# Patient Record
Sex: Female | Born: 1975 | Hispanic: No | Marital: Married | Smoking: Never smoker
Health system: Southern US, Community
[De-identification: ages and names within clinical notes are randomized; demographics above are authoritative.]

## PROBLEM LIST (undated history)

## (undated) DIAGNOSIS — R896 Abnormal cytological findings in specimens from other organs, systems and tissues: Secondary | ICD-10-CM

## (undated) DIAGNOSIS — R42 Dizziness and giddiness: Secondary | ICD-10-CM

## (undated) HISTORY — DX: Abnormal cytological findings in specimens from other organs, systems and tissues: R89.6

---

## 2006-01-27 ENCOUNTER — Other Ambulatory Visit: Admission: RE | Admit: 2006-01-27 | Discharge: 2006-01-27 | Payer: Self-pay | Admitting: Gynecology

## 2007-02-28 ENCOUNTER — Other Ambulatory Visit: Admission: RE | Admit: 2007-02-28 | Discharge: 2007-02-28 | Payer: Self-pay | Admitting: Gynecology

## 2008-10-14 ENCOUNTER — Ambulatory Visit: Payer: Self-pay | Admitting: Gynecology

## 2008-10-14 ENCOUNTER — Other Ambulatory Visit: Admission: RE | Admit: 2008-10-14 | Discharge: 2008-10-14 | Payer: Self-pay | Admitting: Gynecology

## 2008-10-14 ENCOUNTER — Encounter: Payer: Self-pay | Admitting: Gynecology

## 2008-10-25 DIAGNOSIS — IMO0001 Reserved for inherently not codable concepts without codable children: Secondary | ICD-10-CM

## 2008-10-25 HISTORY — DX: Reserved for inherently not codable concepts without codable children: IMO0001

## 2008-10-25 HISTORY — PX: COLPOSCOPY: SHX161

## 2008-10-28 ENCOUNTER — Ambulatory Visit: Payer: Self-pay | Admitting: Gynecology

## 2009-08-06 ENCOUNTER — Other Ambulatory Visit: Admission: RE | Admit: 2009-08-06 | Discharge: 2009-08-06 | Payer: Self-pay | Admitting: Gynecology

## 2009-08-06 ENCOUNTER — Ambulatory Visit: Payer: Self-pay | Admitting: Gynecology

## 2010-06-09 ENCOUNTER — Other Ambulatory Visit
Admission: RE | Admit: 2010-06-09 | Discharge: 2010-06-09 | Payer: Self-pay | Source: Home / Self Care | Admitting: Gynecology

## 2010-06-09 ENCOUNTER — Ambulatory Visit: Payer: Self-pay | Admitting: Gynecology

## 2010-07-01 ENCOUNTER — Ambulatory Visit
Admission: RE | Admit: 2010-07-01 | Discharge: 2010-07-01 | Payer: Self-pay | Source: Home / Self Care | Attending: Gynecology | Admitting: Gynecology

## 2010-07-01 ENCOUNTER — Other Ambulatory Visit
Admission: RE | Admit: 2010-07-01 | Discharge: 2010-07-01 | Payer: Self-pay | Source: Home / Self Care | Admitting: Gynecology

## 2011-07-26 ENCOUNTER — Encounter: Payer: Self-pay | Admitting: Gynecology

## 2011-07-27 ENCOUNTER — Encounter: Payer: Self-pay | Admitting: Gynecology

## 2011-07-27 ENCOUNTER — Other Ambulatory Visit (HOSPITAL_COMMUNITY)
Admission: RE | Admit: 2011-07-27 | Discharge: 2011-07-27 | Disposition: A | Discharge status: Home / Self Care | Payer: Self-pay | Source: Ambulatory Visit | Attending: Gynecology | Admitting: Gynecology

## 2011-07-27 ENCOUNTER — Ambulatory Visit (INDEPENDENT_AMBULATORY_CARE_PROVIDER_SITE_OTHER): Payer: Self-pay | Admitting: Gynecology

## 2011-07-27 VITALS — BP 120/74 | Ht 64.0 in | Wt 139.0 lb

## 2011-07-27 DIAGNOSIS — Z13220 Encounter for screening for lipoid disorders: Secondary | ICD-10-CM

## 2011-07-27 DIAGNOSIS — Z01419 Encounter for gynecological examination (general) (routine) without abnormal findings: Secondary | ICD-10-CM | POA: Insufficient documentation

## 2011-07-27 DIAGNOSIS — Z131 Encounter for screening for diabetes mellitus: Secondary | ICD-10-CM

## 2011-07-27 DIAGNOSIS — R87610 Atypical squamous cells of undetermined significance on cytologic smear of cervix (ASC-US): Secondary | ICD-10-CM

## 2011-07-27 DIAGNOSIS — Z309 Encounter for contraceptive management, unspecified: Secondary | ICD-10-CM

## 2011-07-27 DIAGNOSIS — IMO0001 Reserved for inherently not codable concepts without codable children: Secondary | ICD-10-CM

## 2011-07-27 LAB — URINALYSIS W MICROSCOPIC + REFLEX CULTURE
Ketones, ur: NEGATIVE mg/dL
Nitrite: NEGATIVE
Specific Gravity, Urine: 1.02 (ref 1.005–1.030)
Urobilinogen, UA: 0.2 mg/dL (ref 0.0–1.0)

## 2011-07-27 LAB — GLUCOSE, RANDOM: Glucose, Bld: 89 mg/dL (ref 70–99)

## 2011-07-27 LAB — LIPID PANEL
Cholesterol: 178 mg/dL (ref 0–200)
LDL Cholesterol: 98 mg/dL (ref 0–99)
VLDL: 11 mg/dL (ref 0–40)

## 2011-07-27 LAB — CBC WITH DIFFERENTIAL/PLATELET
Hemoglobin: 13.3 g/dL (ref 12.0–15.0)
Lymphs Abs: 3.5 10*3/uL (ref 0.7–4.0)
Monocytes Relative: 7 % (ref 3–12)
Neutro Abs: 3.9 10*3/uL (ref 1.7–7.7)
Neutrophils Relative %: 47 % (ref 43–77)
Platelets: 264 10*3/uL (ref 150–400)
RBC: 4.15 MIL/uL (ref 3.87–5.11)
WBC: 8.3 10*3/uL (ref 4.0–10.5)

## 2011-07-27 MED ORDER — NORETHINDRONE ACET-ETHINYL EST 1-20 MG-MCG PO TABS: 1.0000 | ORAL_TABLET | Freq: Every day | ORAL | Status: DC

## 2011-07-27 NOTE — Patient Instructions (Signed)
Follow up for routine annual exam in one year.

## 2011-07-27 NOTE — Progress Notes (Signed)
Megan Wise Feb 05, 1976 [id number]        36 y.o.  for annual exam.  Several issues noted below  Past medical history,surgical history, medications, allergies, family history and social history were all reviewed and documented in the EPIC chart. ROS:  Was performed and pertinent positives and negatives are included in the history.  Exam: Kim chaperone present Filed Vitals:   07/27/11 1451  BP: 120/74   General appearance  Normal Skin grossly normal Head/Neck normal with no cervical or supraclavicular adenopathy thyroid normal Lungs  clear Cardiac RR, without RMG Abdominal  soft, nontender, without masses, organomegaly or hernia Breasts  examined lying and sitting without masses, retractions, discharge or axillary adenopathy. Pelvic  Ext/BUS/vagina  normal   Cervix  normal high in vaginal vault and anterior Pap done  Uterus  retroverted, normal size, shape and contour, midline and mobile nontender   Adnexa  Without masses or tenderness    Anus and perineum  normal   Rectovaginal  normal sphincter tone without palpated masses or tenderness.    Assessment/Plan:  36 y.o. female for annual exam.   She brought an interpreter who discussed all of the following with her. 1. 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 Colposcopy inadequate ECC negative. Follow up Pap smears were negative. Pap done today. If negative then plan annual follow up. 2. Birth control. Patient stopped her birth control pills saying she did not like the way it made her feel. I reviewed options for contraception and she wants to go ahead and restart low-dose oral contraceptives. I reviewed the risks to include stroke heart attack DVT all of which she understands and accepts and I refilled her with Loestrin 120 equivalent x1 year Sunday start after next menses backup contraception with condoms. 3. Breast health. SBE monthly reviewed. Screening mammograms between 35 and 40 were discussed. She has no strong family  history and will probably wait till 61 for her baseline at her choice. 4. Right hand numbness and swelling. Patient notes intermittently several times during the month her right hand seem to swell with some numbness and tingling when she wakes up in the morning. Her exam today is normal with good capillary refill good pulses no evidence of swelling good muscle strength and sensation. She has no neck pain or limitations in movement. I reviewed with her as it is occasional was waking I suspect that she is "sleeping wrong" and I would watch for now. Symptoms more consistent I will refer or further evaluation. 5. Health maintenance. Will check baseline CBC glucose lipid profile urinalysis. Assuming she starts the pills does well she will see me in a year, sooner as needed    Dara Lords MD, 3:37 PM 07/27/2011

## 2011-08-17 ENCOUNTER — Ambulatory Visit (INDEPENDENT_AMBULATORY_CARE_PROVIDER_SITE_OTHER): Payer: Self-pay | Admitting: Gynecology

## 2011-08-17 ENCOUNTER — Other Ambulatory Visit (HOSPITAL_COMMUNITY)
Admission: RE | Admit: 2011-08-17 | Discharge: 2011-08-17 | Disposition: A | Discharge status: Home / Self Care | Payer: Self-pay | Source: Ambulatory Visit | Attending: Gynecology | Admitting: Gynecology

## 2011-08-17 ENCOUNTER — Encounter: Payer: Self-pay | Admitting: Gynecology

## 2011-08-17 DIAGNOSIS — R87616 Satisfactory cervical smear but lacking transformation zone: Secondary | ICD-10-CM

## 2011-08-17 DIAGNOSIS — Z01419 Encounter for gynecological examination (general) (routine) without abnormal findings: Secondary | ICD-10-CM | POA: Insufficient documentation

## 2011-08-17 NOTE — Progress Notes (Signed)
Patient presents for re\re Pap. She had a recent annual exam her Pap smear was normal but in adequate as no endocervical cells were seen.  She does have a history of ascus with positive high risk HPV in 2010 with a negative colposcopy and a negative ECC. Her intervening Pap smears have been negative.  Exam was Sherrilyn Rist chaperone present. External BUS vagina normal. Cervix normal. Pap done with endocervical mucus noted on the brush.  Assessment and plan: Inadequate Pap smear. Pap redone today. Assuming negative then annual follow up.

## 2011-08-17 NOTE — Patient Instructions (Signed)
Follow up for Pap smear results. Assuming normal then follow up in one year.

## 2012-01-29 ENCOUNTER — Ambulatory Visit (INDEPENDENT_AMBULATORY_CARE_PROVIDER_SITE_OTHER): Payer: BC Managed Care – PPO | Admitting: Family Medicine

## 2012-01-29 VITALS — BP 106/67 | HR 67 | Temp 98.1°F | Resp 16 | Ht 63.5 in | Wt 136.0 lb

## 2012-01-29 DIAGNOSIS — T63481A Toxic effect of venom of other arthropod, accidental (unintentional), initial encounter: Secondary | ICD-10-CM

## 2012-01-29 DIAGNOSIS — T6391XA Toxic effect of contact with unspecified venomous animal, accidental (unintentional), initial encounter: Secondary | ICD-10-CM

## 2012-01-29 MED ORDER — PREDNISONE 20 MG PO TABS: ORAL_TABLET | ORAL | Status: AC

## 2012-01-29 NOTE — Patient Instructions (Addendum)
Apply ice and elevate your hand. You can apply ice for about 15 minutes at a time- use a towel to protect your hand from the ice You can take ibuprofen (advil) as needed for swelling and pain Benadryl can help with itching  As long as you are getting better in the next day or so, nothing further is needed. If your swelling and itching do not go away, you may use your prescription for prednisone.

## 2012-01-29 NOTE — Progress Notes (Signed)
[facility] [address], [place] [address] [phone]  Date:  01/29/2012   Name:  Megan Wise   DOB:  [birth year]   MRN:  [id number]  PCP:  Dara Lords, MD    Chief Complaint: Insect Bite   History of Present Illness:  Megan Wise is a 36 y.o. very pleasant female patient who presents with the following:  She was stung on he left hand by a ?hornet yesterday afternoon.  The bee was in her workplace and stung her.  The area itches and hurts.  She has no history of severe allergic reactions to insect stings. She has not noted any SOB, lip/ tongue/ face swelling, or other worrisome symptoms. Mceachron is generally healthy and has no other health problems that she knows of (excpet as noted below). It seems that some friends told them that Trenyce needed to have a shot of some type for her sting, so they came to clinic today for evaluation.  They have not yet tried any medications or ice for her symptoms.    LMP 01/22/12  Patient Active Problem List  Diagnosis  . ASCUS (atypical squamous cells of undetermined significance) on Pap smear    Past Medical History  Diagnosis Date  . ASCUS (atypical squamous cells of undetermined significance) on Pap smear 10/2008    High risk HPV    Past Surgical History  Procedure Date  . Cesarean section   . Colposcopy 10/2008    ECC neg    History  Substance Use Topics  . Smoking status: Never Smoker   . Smokeless tobacco: Not on file  . Alcohol Use: 1.0 oz/week    2 drink(s) per week    No family history on file.  No Known Allergies  Medication list has been reviewed and updated.  Current Outpatient Prescriptions on File Prior to Visit  Medication Sig Dispense Refill  . Multiple Vitamin (MULTIVITAMIN) tablet Take 1 tablet by mouth daily.      . norethindrone-ethinyl estradiol (MICROGESTIN,JUNEL,LOESTRIN) 1-20 MG-MCG tablet Take 1 tablet by mouth daily.  1 Package  11    Review of Systems:  As per HPI-  otherwise negative.   Physical Examination: Filed Vitals:   01/29/12 0903  BP: 106/67  Pulse: 67  Temp: 98.1 F (36.7 C)  Resp: 16   Filed Vitals:   01/29/12 0903  Height: 5' 3.5" (1.613 m)  Weight: 136 lb (61.689 kg)   Body mass index is 23.71 kg/(m^2). Ideal Body Weight: Weight in (lb) to have BMI = 25: 143.1   GEN: WDWN, NAD, Non-toxic, A & O x 3, here today with her husband. There is some language barrier with [place] but her husband has adequate English and helps interpret HEENT: Atraumatic, Normocephalic. Neck supple. No masses, No LAD.  PEERL.  Oropharynx wnl- there is no angioedema, no lip or tongue swelling.   Ears and Nose: No external deformity. CV: RRR, No M/G/R. No JVD. No thrill. No extra heart sounds. PULM: CTA B, no wheezes, crackles, rhonchi. No retractions. No resp. distress. No accessory muscle use. ABD: S, NT, ND.   EXTR: No c/c NEURO Normal gait.  PSYCH: Normally interactive. Conversant. Not depressed or anxious appearing.  Calm demeanor.  Left hand: there is evidence of an insect sting on the dorsum of her hand.  There is mild localized swelling on the ulnar side of the hand, and some mild swelling of the long, ring and pinky fingers.  Normal cap refill,  grip strength is impeded due to swelling.    Assessment and Plan: 1. Insect sting  predniSONE (DELTASONE) 20 MG tablet   Went over care of insect sting- see patient instructions.  Suspect that Mariabella will be better in the next day or so.  If she is not, may fill prednisone to help resolve symptoms.  However, at this point explained that I think potential harms of steroid injection outweigh benefits.   Abbe Amsterdam, MD

## 2012-03-07 ENCOUNTER — Ambulatory Visit (INDEPENDENT_AMBULATORY_CARE_PROVIDER_SITE_OTHER): Payer: BC Managed Care – PPO | Admitting: Family Medicine

## 2012-03-07 VITALS — BP 97/60 | HR 91 | Temp 98.1°F | Resp 16 | Ht 63.0 in | Wt 141.0 lb

## 2012-03-07 DIAGNOSIS — L509 Urticaria, unspecified: Secondary | ICD-10-CM

## 2012-03-07 MED ORDER — METHYLPREDNISOLONE 4 MG PO KIT: PACK | ORAL | Status: AC

## 2012-03-07 MED ORDER — METHYLPREDNISOLONE 4 MG PO KIT: PACK | ORAL | Status: DC

## 2012-03-07 NOTE — Patient Instructions (Addendum)
Buy Benadryl 25mg  over the counter. Please take Benadryl 50mg  tonight when you get home (2 pills) This will make you drowsy, please be careful. If the rash has not improved by the morning, you may fill the prescription for steroids at the pharmacy and take as directed.   Hives Hives are itchy, red, puffy patches on the skin. Hives may change in size and shape. They may also show up in new places on the body. Hives may be a reaction to something that was eaten, touched, or put on the skin. Hives can also be a reaction to cold, heat, infections, medicine, insect bites, or stress. Hives cannot spread from one person to another. HOME CARE  Stay away from what caused the hives, if this applies.   To relieve itching and rash:   Apply cold packs to the skin. A cool bath may also help. Do not take hot baths or showers. Warm water makes the itching worse.   Take medicine to shrink the hives as told by your doctor. This medicine can make you sleepy. Do not drive if you take this medicine.   Only take medicine as told by your doctor.   Wear loose-fitting clothes and underwear.   Follow up with your doctor.  GET HELP RIGHT AWAY IF:   You have a fever.   Your lips or tongue puff up (swell).   Breathing or swallowing is hard to do.   Throat or chest tightness develops.   Belly (abdominal) pain develops.  These may be the first signs of a life-threatening allergic reaction. This is an emergency. Call your local emergency services (911 in [place] MAKE SURE YOU:   Understand these instructions.   Will watch your condition.   Will get help right away if you are not doing well or get worse.  Document Released: 03/22/2008 Document Revised: 06/02/2011 Document Reviewed: 05/19/2009 [facility] Patient Information 2012 [place], [place].

## 2012-03-07 NOTE — Progress Notes (Signed)
[facility]:  Office Visit  Chief Complaint:  Chief Complaint  Patient presents with  . Urticaria    All over; started last night    HPI: Megan Wise is a 36 y.o. female who complains of  Hives all over on legs and hands. No known triggers except possible tuna. No new meds, detergents/soaps, travels, insect bites. Denies CP/SOB. No problems with face or chest. Has not tried anythign for it. Had chicken and rice for dinenr last night but prior to that had tuna. Has had tuna prior without any problems.   Past Medical History  Diagnosis Date  . ASCUS (atypical squamous cells of undetermined significance) on Pap smear 10/2008    High risk HPV   Past Surgical History  Procedure Date  . Cesarean section   . Colposcopy 10/2008    ECC neg   History   Social History  . Marital Status: Married    Spouse Name: N/A    Number of Children: N/A  . Years of Education: N/A   Social History Main Topics  . Smoking status: Never Smoker   . Smokeless tobacco: None  . Alcohol Use: 1.0 oz/week    2 drink(s) per week  . Drug Use: No  . Sexually Active: Yes    Birth Control/ Protection: Condom   Other Topics Concern  . None   Social History Narrative  . None   No family history on file. No Known Allergies Prior to Admission medications   Not on File     ROS: The patient denies fevers, chills, night sweats, unintentional weight loss, chest pain, palpitations, wheezing, dyspnea on exertion, nausea, vomiting, abdominal pain, dysuria, hematuria, melena, numbness, weakness, or tingling.   All other systems have been reviewed and were otherwise negative with the exception of those mentioned in the HPI and as above.    PHYSICAL EXAM: Filed Vitals:   03/07/12 1629  BP: 97/60  Pulse: 91  Temp: 98.1 F (36.7 C)  Resp: 16   Filed Vitals:   03/07/12 1629  Height: 5\' 3"  (1.6 m)  Weight: 141 lb (63.957 kg)   Body mass index is 24.98 kg/(m^2).  General: Alert, no  acute distress HEENT:  Normocephalic, atraumatic, oropharynx patent. No exudates Cardiovascular:  Regular rate and rhythm, no rubs murmurs or gallops.  No Carotid bruits, radial pulse intact. No pedal edema.  Respiratory: Clear to auscultation bilaterally.  No wheezes, rales, or rhonchi.  No cyanosis, no use of accessory musculature GI: No organomegaly, abdomen is soft and non-tender, positive bowel sounds.  No masses. Skin: Diffues hives UE and Megan Wise bilaterally and on back. None on face or chest.  Neurologic: Facial musculature symmetric. Psychiatric: Patient is appropriate throughout our interaction. Lymphatic: No cervical lymphadenopathy Musculoskeletal: Gait intact.   LABS: Results for orders placed in visit on 07/27/11  CBC WITH DIFFERENTIAL      Component Value Range   WBC 8.3  4.0 - 10.5 K/uL   RBC 4.15  3.87 - 5.11 MIL/uL   Hemoglobin 13.3  12.0 - 15.0 g/dL   HCT 16.1  09.6 - 04.5 %   MCV 93.0  78.0 - 100.0 fL   MCH 32.0  26.0 - 34.0 pg   MCHC 34.5  30.0 - 36.0 g/dL   RDW 40.9  81.1 - 91.4 %   Platelets 264  150 - 400 K/uL   Neutrophils Relative 47  43 - 77 %   Neutro Abs 3.9  1.7 - 7.7  K/uL   Lymphocytes Relative 43  12 - 46 %   Lymphs Abs 3.5  0.7 - 4.0 K/uL   Monocytes Relative 7  3 - 12 %   Monocytes Absolute 0.6  0.1 - 1.0 K/uL   Eosinophils Relative 3  0 - 5 %   Eosinophils Absolute 0.2  0.0 - 0.7 K/uL   Basophils Relative 1  0 - 1 %   Basophils Absolute 0.1  0.0 - 0.1 K/uL   Smear Review Criteria for review not met    GLUCOSE, RANDOM      Component Value Range   Glucose, Bld 89  70 - 99 mg/dL  LIPID PANEL      Component Value Range   Cholesterol 178  0 - 200 mg/dL   Triglycerides 53  <161 mg/dL   HDL 69  >09 mg/dL   Total CHOL/HDL Ratio 2.6     VLDL 11  0 - 40 mg/dL   LDL Cholesterol 98  0 - 99 mg/dL  URINALYSIS WITH CULTURE REFLEX      Component Value Range   Color, Urine YELLOW  YELLOW   APPearance CLEAR  CLEAR   Specific Gravity, Urine 1.020  1.005 -  1.030   pH 8.5 (*) 5.0 - 8.0   Glucose, UA NEG  NEG mg/dL   Bilirubin Urine NEG  NEG   Ketones, ur NEG  NEG mg/dL   Hgb urine dipstick NEG  NEG   Protein, ur NEG  NEG mg/dL   Urobilinogen, UA 0.2  0.0 - 1.0 mg/dL   Nitrite NEG  NEG   Leukocytes, UA NEG  NEG     EKG/XRAY:   Primary read interpreted by Dr. Conley Rolls at [facility].   ASSESSMENT/PLAN: Encounter Diagnosis  Name Primary?  Marland Kitchen Urticaria, unspecified Yes   Take Benadryl 50 mg x 1 tonight, then 25 mg q8h until resolved. IF no improvement after 50 mg then need to take medrol dose pack. Rx given.  Go to ER prn for SOB/CP     Megan Deems PHUONG, DO 03/07/2012 5:23 PM

## 2013-05-10 ENCOUNTER — Encounter: Payer: BC Managed Care – PPO | Admitting: Gynecology

## 2013-05-31 ENCOUNTER — Ambulatory Visit (INDEPENDENT_AMBULATORY_CARE_PROVIDER_SITE_OTHER): Payer: BC Managed Care – PPO | Admitting: Gynecology

## 2013-05-31 ENCOUNTER — Other Ambulatory Visit (HOSPITAL_COMMUNITY)
Admission: RE | Admit: 2013-05-31 | Discharge: 2013-05-31 | Disposition: A | Discharge status: Home / Self Care | Payer: BC Managed Care – PPO | Source: Ambulatory Visit | Attending: Gynecology | Admitting: Gynecology

## 2013-05-31 ENCOUNTER — Encounter: Payer: Self-pay | Admitting: Gynecology

## 2013-05-31 VITALS — BP 120/76 | Ht 64.0 in | Wt 154.0 lb

## 2013-05-31 DIAGNOSIS — Z1151 Encounter for screening for human papillomavirus (HPV): Secondary | ICD-10-CM | POA: Insufficient documentation

## 2013-05-31 DIAGNOSIS — Z01419 Encounter for gynecological examination (general) (routine) without abnormal findings: Secondary | ICD-10-CM

## 2013-05-31 DIAGNOSIS — R42 Dizziness and giddiness: Secondary | ICD-10-CM

## 2013-05-31 LAB — COMPREHENSIVE METABOLIC PANEL
ALT: 23 U/L (ref 0–35)
Albumin: 3.8 g/dL (ref 3.5–5.2)
Alkaline Phosphatase: 75 U/L (ref 39–117)
BUN: 10 mg/dL (ref 6–23)
Calcium: 9.3 mg/dL (ref 8.4–10.5)
Creat: 0.72 mg/dL (ref 0.50–1.10)
Glucose, Bld: 100 mg/dL — ABNORMAL HIGH (ref 70–99)
Potassium: 4 mEq/L (ref 3.5–5.3)
Total Bilirubin: 0.5 mg/dL (ref 0.3–1.2)
Total Protein: 6.9 g/dL (ref 6.0–8.3)

## 2013-05-31 LAB — CBC WITH DIFFERENTIAL/PLATELET
Basophils Absolute: 0.1 10*3/uL (ref 0.0–0.1)
Basophils Relative: 1 % (ref 0–1)
Eosinophils Absolute: 0.2 10*3/uL (ref 0.0–0.7)
Eosinophils Relative: 2 % (ref 0–5)
HCT: 41.2 % (ref 36.0–46.0)
MCH: 31.7 pg (ref 26.0–34.0)
MCHC: 34.5 g/dL (ref 30.0–36.0)
Monocytes Absolute: 0.7 10*3/uL (ref 0.1–1.0)
Monocytes Relative: 7 % (ref 3–12)
Neutro Abs: 5.8 10*3/uL (ref 1.7–7.7)
RDW: 13.3 % (ref 11.5–15.5)

## 2013-05-31 LAB — LIPID PANEL: Cholesterol: 176 mg/dL (ref 0–200)

## 2013-05-31 LAB — TSH: TSH: 1.517 u[IU]/mL (ref 0.350–4.500)

## 2013-05-31 NOTE — Addendum Note (Signed)
Addended by: Dayna Barker on: 05/31/2013 12:50 PM   Modules accepted: Orders

## 2013-05-31 NOTE — Patient Instructions (Signed)
Office will call you to help arrange neurology appointment. If you do not hear from the office in one week call us. Followup in one year for annual exam.

## 2013-05-31 NOTE — Progress Notes (Signed)
Megan Wise Jan 18, 1976 [id number]        37 y.o.  G2P1011 for annual exam.  Several issues noted below.  Past medical history,surgical history, problem list, medications, allergies, family history and social history were all reviewed and documented in the EPIC chart.  ROS:  Performed and pertinent positives and negatives are included in the history, assessment and plan .  Exam: Kim assistant Filed Vitals:   05/31/13 1214  BP: 120/76  Height: 5\' 4"  (1.626 m)  Weight: 154 lb (69.854 kg)   General appearance  Normal Skin grossly normal Head/Neck normal with no cervical or supraclavicular adenopathy thyroid normal Lungs  clear Cardiac RR, without RMG Abdominal  soft, nontender, without masses, organomegaly or hernia Breasts  examined lying and sitting without masses, retractions, discharge or axillary adenopathy. Pelvic  Ext/BUS/vagina  Normal   Cervix  Normal Pap/HPV  Uterus  retroverted, normal size, shape and contour, midline and mobile nontender   Adnexa  Without masses or tenderness    Anus and perineum  Normal   Rectovaginal  Normal sphincter tone without palpated masses or tenderness.    Assessment/Plan:  37 y.o. G30P1011 female for annual exam, regular menses, condom contraception.   1. Contraceptive management. Patient stopped her birth control pills. Is using condoms and happy with this. Discussed increased failure risk and availability of plan B. Will followup if she decides for alternatives. 2. Dizzy spells. Patient having weekly dizzy spells last seconds to a minute. Unrelated to position or positional changes. Occurs throughout the day unrelated to meals. Gen. physical exam is normal without localizing neurologic findings. Check baseline studies to include CBC comp and some metabolic panel thyroid. Recommend neurologist evaluation and we will help her arrange this and she knows to followup for this. 3. Pap smear 2013. Pap/HPV today. History of ASCUS with positive high-risk  HPV 2010. Negative colposcopy with ECC. Assuming this Pap smear normal then we'll plan less frequent screening intervals. 4. Breast health. SBE monthly reviewed. Screening mammographic recommendations between 35 and 40 reviewed. Patient turning 37 this coming year and will schedule mammography. 5. Health maintenance. Baseline CBC comprehensive metabolic panel lipid profile urinalysis TSH ordered. Followup one year, sooner as needed.   Note: This document was prepared with digital dictation and possible smart phrase technology. Any transcriptional errors that result from this process are unintentional.   Dara Lords MD, 12:42 PM 05/31/2013

## 2013-06-01 LAB — URINALYSIS W MICROSCOPIC + REFLEX CULTURE
Bilirubin Urine: NEGATIVE
Crystals: NONE SEEN
Glucose, UA: NEGATIVE mg/dL
Specific Gravity, Urine: 1.024 (ref 1.005–1.030)
Squamous Epithelial / LPF: NONE SEEN
Urobilinogen, UA: 0.2 mg/dL (ref 0.0–1.0)

## 2013-06-03 ENCOUNTER — Telehealth: Payer: Self-pay | Admitting: *Deleted

## 2013-06-03 NOTE — Telephone Encounter (Signed)
Message copied by Aura Camps on Mon Jun 03, 2013  9:00 AM ------      Message from: Dara Lords      Created: Fri May 31, 2013  2:28 PM       Patient needs appointment to see neurologist such as Dr. Amelia Jo reference recurrent dizzy episodes ------

## 2013-06-03 NOTE — Telephone Encounter (Signed)
appt on 06/10/13 @ 10:15 am, time and date informed by pt friend who has DPR access Ratana.

## 2014-04-28 ENCOUNTER — Encounter: Payer: Self-pay | Admitting: Gynecology

## 2014-07-01 ENCOUNTER — Encounter: Payer: Self-pay | Admitting: Gynecology

## 2014-07-01 ENCOUNTER — Ambulatory Visit (INDEPENDENT_AMBULATORY_CARE_PROVIDER_SITE_OTHER): Payer: 59 | Admitting: Gynecology

## 2014-07-01 VITALS — BP 120/70 | Ht 63.0 in | Wt 154.0 lb

## 2014-07-01 DIAGNOSIS — N951 Menopausal and female climacteric states: Secondary | ICD-10-CM

## 2014-07-01 DIAGNOSIS — Z01419 Encounter for gynecological examination (general) (routine) without abnormal findings: Secondary | ICD-10-CM | POA: Diagnosis not present

## 2014-07-01 LAB — LIPID PANEL
CHOL/HDL RATIO: 2.8 ratio
CHOLESTEROL: 186 mg/dL (ref 0–200)
HDL: 67 mg/dL (ref >39)
LDL Cholesterol: 88 mg/dL (ref 0–99)
Triglycerides: 153 mg/dL — ABNORMAL HIGH (ref <150)
VLDL: 31 mg/dL (ref 0–40)

## 2014-07-01 LAB — COMPREHENSIVE METABOLIC PANEL
ALT: 23 U/L (ref 0–35)
AST: 16 U/L (ref 0–37)
Albumin: 4.1 g/dL (ref 3.5–5.2)
Alkaline Phosphatase: 70 U/L (ref 39–117)
BILIRUBIN TOTAL: 0.3 mg/dL (ref 0.2–1.2)
BUN: 11 mg/dL (ref 6–23)
CO2: 30 mEq/L (ref 19–32)
Calcium: 9.2 mg/dL (ref 8.4–10.5)
Chloride: 101 mEq/L (ref 96–112)
Creat: 0.75 mg/dL (ref 0.50–1.10)
Glucose, Bld: 92 mg/dL (ref 70–99)
Potassium: 5 mEq/L (ref 3.5–5.3)
SODIUM: 136 meq/L (ref 135–145)
Total Protein: 7 g/dL (ref 6.0–8.3)

## 2014-07-01 LAB — TSH: TSH: 2.378 u[IU]/mL (ref 0.350–4.500)

## 2014-07-01 NOTE — Progress Notes (Signed)
Megan Wise [birth year] [id number]        39 y.o.  G2P1011 for annual exam.  Several issues noted below.  Past medical history,surgical history, problem list, medications, allergies, family history and social history were all reviewed and documented as reviewed in the EPIC chart.  ROS:  Performed with pertinent positives and negatives included in the history, assessment and plan.   Additional significant findings :  none   Exam: [facility] personassistant Filed Vitals:   07/01/14 1536  BP: 120/70  Height: 5\' 3"  (1.6 m)  Weight: 154 lb (69.854 kg)   General appearance:  Normal affect, orientation and appearance. Skin: Grossly normal HEENT: Without gross lesions.  No cervical or supraclavicular adenopathy. Thyroid normal.  Lungs:  Clear without wheezing, rales or rhonchi Cardiac: RR, without RMG Abdominal:  Soft, nontender, without masses, guarding, rebound, organomegaly or hernia Breasts:  Examined lying and sitting without masses, retractions, discharge or axillary adenopathy. Pelvic:  Ext/BUS/vagina normal  Cervix normal  Uterus retroverted, normal size, shape and contour, midline and mobile nontender   Adnexa  Without masses or tenderness    Anus and perineum  Normal   Rectovaginal  Normal sphincter tone without palpated masses or tenderness.    Assessment/Plan:  39 y.o. 502P1011 female for annual exam with regular menses, condom contraception.   1. Menopausal symptoms. Patient notes some hot flashes coming and going. Also some weight gain and increased hair loss. Exam is normal without evidence of alopecia. Menses are regular. Check baseline labs to include TSH and FSH. Assuming negative then monitor at present. If symptoms continue or certainly worsen will call and possibly refer to internal medicine for further evaluation. 2. Contraception. Patient continues with condoms. We have discussed this multiple times again reviewed the failure risk. Patient understands and accepts does not want  alternatives. 3. Mammogram never. Recommended baseline mammogram now she is turned 38 and she agrees to schedule.  SBE monthly reviewed. 4. Pap smear/HPV negative 2014. No Pap smear done today. History of ASCUS positive high-risk HPV 2010. Colposcopy/ECC negative. Follow up Pap smears normal. Plan Pap smear at 3-5 year interval per current screening guidelines. 5. Health maintenance. Baseline CBC comprehensive metabolic panel lipid profile urinalysis ordered with both TSH and FSH. Follow up for results otherwise 1 year, sooner as needed.     [name],[name] MD, 4:02 PM 07/01/2014

## 2014-07-01 NOTE — Patient Instructions (Addendum)
Call to Schedule your mammogram  Facilities in [place]: 1)  [facility], [address] Phone: [phone] 2)  [facility]. [address] [place] Phone: [phone] 3)  Dr. Isaiah Blakes at [facility] [address] Phone: [phone]     Mammogram A mammogram is an X-ray test to find changes in a woman's breast. You should get a mammogram if:  You are 41 years of age or older  You have risk factors.   Your doctor recommends that you have one.  BEFORE THE TEST  Do not schedule the test the week before your period, especially if your breasts are sore during this time.  On the day of your mammogram:  Wash your breasts and armpits well. After washing, do not put on any deodorant or talcum powder on until after your test.   Eat and drink as you usually do.   Take your medicines as usual.   If you are diabetic and take insulin, make sure you:   Eat before coming for your test.   Take your insulin as usual.   If you cannot keep your appointment, call before the appointment to cancel. Schedule another appointment.  TEST  You will need to undress from the waist up. You will put on a hospital gown.   Your breast will be put on the mammogram machine, and it will press firmly on your breast with a piece of plastic called a compression paddle. This will make your breast flatter so that the machine can X-ray all parts of your breast.   Both breasts will be X-rayed. Each breast will be X-rayed from above and from the side. An X-ray might need to be taken again if the picture is not good enough.   The mammogram will last about 15 to 30 minutes.  AFTER THE TEST Finding out the results of your test Ask when your test results will be ready. Make sure you get your test results.  Document Released: 09/09/2008 Document Revised: 06/02/2011 Document Reviewed: 09/09/2008 [facility] Patient  Information 2012 [place].  You may obtain a copy of any labs that were done today by logging onto MyChart as outlined in the instructions provided with your AVS (after visit summary). The office will not call with normal lab results but certainly if there are any significant abnormalities then we will contact you.   Health Maintenance, Female A healthy lifestyle and preventative care can promote health and wellness.  Maintain regular health, dental, and eye exams.  Eat a healthy diet. Foods like vegetables, fruits, whole grains, low-fat dairy products, and lean protein foods contain the nutrients you need without too many calories. Decrease your intake of foods high in solid fats, added sugars, and salt. Get information about a proper diet from your caregiver, if necessary.  Regular physical exercise is one of the most important things you can do for your health. Most adults should get at least 150 minutes of moderate-intensity exercise (any activity that increases your heart rate and causes you to sweat) each week. In addition, most adults need muscle-strengthening exercises on 2 or more days a week.   Maintain a healthy weight. The body mass index (BMI) is a screening tool to identify possible weight problems. It provides an estimate of body fat based on height and weight. Your caregiver can help determine your BMI, and can help you achieve or maintain a healthy weight. For adults  20 years and older:  A BMI below 18.5 is considered underweight.  A BMI of 18.5 to 24.9 is normal.  A BMI of 25 to 29.9 is considered overweight.  A BMI of 30 and above is considered obese.  Maintain normal blood lipids and cholesterol by exercising and minimizing your intake of saturated fat. Eat a balanced diet with plenty of fruits and vegetables. Blood tests for lipids and cholesterol should begin at age 20 and be repeated every 5 years. If your lipid or cholesterol levels are high, you are over 50, or  you are a high risk for heart disease, you may need your cholesterol levels checked more frequently.Ongoing high lipid and cholesterol levels should be treated with medicines if diet and exercise are not effective.  If you smoke, find out from your caregiver how to quit. If you do not use tobacco, do not start.  Lung cancer screening is recommended for adults aged 55 80 years who are at high risk for developing lung cancer because of a history of smoking. Yearly low-dose computed tomography (CT) is recommended for people who have at least a 30-pack-year history of smoking and are a current smoker or have quit within the past 15 years. A pack year of smoking is smoking an average of 1 pack of cigarettes a day for 1 year (for example: 1 pack a day for 30 years or 2 packs a day for 15 years). Yearly screening should continue until the smoker has stopped smoking for at least 15 years. Yearly screening should also be stopped for people who develop a health problem that would prevent them from having lung cancer treatment.  If you are pregnant, do not drink alcohol. If you are breastfeeding, be very cautious about drinking alcohol. If you are not pregnant and choose to drink alcohol, do not exceed 1 drink per day. One drink is considered to be 12 ounces (355 mL) of beer, 5 ounces (148 mL) of wine, or 1.5 ounces (44 mL) of liquor.  Avoid use of street drugs. Do not share needles with anyone. Ask for help if you need support or instructions about stopping the use of drugs.  High blood pressure causes heart disease and increases the risk of stroke. Blood pressure should be checked at least every 1 to 2 years. Ongoing high blood pressure should be treated with medicines, if weight loss and exercise are not effective.  If you are 55 to 41 years old, ask your caregiver if you should take aspirin to prevent strokes.  Diabetes screening involves taking a blood sample to check your fasting blood sugar level. This  should be done once every 3 years, after age 45, if you are within normal weight and without risk factors for diabetes. Testing should be considered at a younger age or be carried out more frequently if you are overweight and have at least 1 risk factor for diabetes.  Breast cancer screening is essential preventative care for women. You should practice "breast self-awareness." This means understanding the normal appearance and feel of your breasts and may include breast self-examination. Any changes detected, no matter how small, should be reported to a caregiver. Women in their 20s and 30s should have a clinical breast exam (CBE) by a caregiver as part of a regular health exam every 1 to 3 years. After age 40, women should have a CBE every year. Starting at age 40, women should consider having a mammogram (breast X-ray) every year. Women who have a   family history of breast cancer should talk to their caregiver about genetic screening. Women at a high risk of breast cancer should talk to their caregiver about having an MRI and a mammogram every year.  Breast cancer gene (BRCA)-related cancer risk assessment is recommended for women who have family members with BRCA-related cancers. BRCA-related cancers include breast, ovarian, tubal, and peritoneal cancers. Having family members with these cancers may be associated with an increased risk for harmful changes (mutations) in the breast cancer genes BRCA1 and BRCA2. Results of the assessment will determine the need for genetic counseling and BRCA1 and BRCA2 testing.  The Pap test is a screening test for cervical cancer. Women should have a Pap test starting at age 33. Between ages 75 and 14, Pap tests should be repeated every 2 years. Beginning at age 4, you should have a Pap test every 3 years as long as the past 3 Pap tests have been normal. If you had a hysterectomy for a problem that was not cancer or a condition that could lead to cancer, then you no longer  need Pap tests. If you are between ages 72 and 12, and you have had normal Pap tests going back 10 years, you no longer need Pap tests. If you have had past treatment for cervical cancer or a condition that could lead to cancer, you need Pap tests and screening for cancer for at least 20 years after your treatment. If Pap tests have been discontinued, risk factors (such as a new sexual partner) need to be reassessed to determine if screening should be resumed. Some women have medical problems that increase the chance of getting cervical cancer. In these cases, your caregiver may recommend more frequent screening and Pap tests.  The human papillomavirus (HPV) test is an additional test that may be used for cervical cancer screening. The HPV test looks for the virus that can cause the cell changes on the cervix. The cells collected during the Pap test can be tested for HPV. The HPV test could be used to screen women aged 84 years and older, and should be used in women of any age who have unclear Pap test results. After the age of 73, women should have HPV testing at the same frequency as a Pap test.  Colorectal cancer can be detected and often prevented. Most routine colorectal cancer screening begins at the age of 56 and continues through age 76. However, your caregiver may recommend screening at an earlier age if you have risk factors for colon cancer. On a yearly basis, your caregiver may provide home test kits to check for hidden blood in the stool. Use of a small camera at the end of a tube, to directly examine the colon (sigmoidoscopy or colonoscopy), can detect the earliest forms of colorectal cancer. Talk to your caregiver about this at age 20, when routine screening begins. Direct examination of the colon should be repeated every 5 to 10 years through age 83, unless early forms of pre-cancerous polyps or small growths are found.  Hepatitis C blood testing is recommended for all people born from [birth year]  through 1965 and any individual with known risks for hepatitis C.  Practice safe sex. Use condoms and avoid high-risk sexual practices to reduce the spread of sexually transmitted infections (STIs). Sexually active women aged 28 and younger should be checked for Chlamydia, which is a common sexually transmitted infection. Older women with new or multiple partners should also be tested for Chlamydia. Testing for  other STIs is recommended if you are sexually active and at increased risk.  Osteoporosis is a disease in which the bones lose minerals and strength with aging. This can result in serious bone fractures. The risk of osteoporosis can be identified using a bone density scan. Women ages 35 and over and women at risk for fractures or osteoporosis should discuss screening with their caregivers. Ask your caregiver whether you should be taking a calcium supplement or vitamin D to reduce the rate of osteoporosis.  Menopause can be associated with physical symptoms and risks. Hormone replacement therapy is available to decrease symptoms and risks. You should talk to your caregiver about whether hormone replacement therapy is right for you.  Use sunscreen. Apply sunscreen liberally and repeatedly throughout the day. You should seek shade when your shadow is shorter than you. Protect yourself by wearing long sleeves, pants, a wide-brimmed hat, and sunglasses year round, whenever you are outdoors.  Notify your caregiver of new moles or changes in moles, especially if there is a change in shape or color. Also notify your caregiver if a mole is larger than the size of a pencil eraser.  Stay current with your immunizations. Document Released: 12/27/2010 Document Revised: 10/08/2012 Document Reviewed: 12/27/2010 [facility] Patient Information 2014 [place].

## 2014-07-02 LAB — CBC WITH DIFFERENTIAL/PLATELET
BASOS ABS: 0 10*3/uL (ref 0.0–0.1)
Basophils Relative: 0 % (ref 0–1)
Eosinophils Absolute: 0.2 10*3/uL (ref 0.0–0.7)
Eosinophils Relative: 2 % (ref 0–5)
HCT: 42.5 % (ref 36.0–46.0)
Hemoglobin: 14.1 g/dL (ref 12.0–15.0)
Lymphocytes Relative: 40 % (ref 12–46)
Lymphs Abs: 4 10*3/uL (ref 0.7–4.0)
MCH: 31.9 pg (ref 26.0–34.0)
MCHC: 33.2 g/dL (ref 30.0–36.0)
MCV: 96.2 fL (ref 78.0–100.0)
MONO ABS: 0.9 10*3/uL (ref 0.1–1.0)
MPV: 9.2 fL (ref 8.6–12.4)
Monocytes Relative: 9 % (ref 3–12)
NEUTROS ABS: 4.9 10*3/uL (ref 1.7–7.7)
Neutrophils Relative %: 49 % (ref 43–77)
PLATELETS: 281 10*3/uL (ref 150–400)
RBC: 4.42 MIL/uL (ref 3.87–5.11)
RDW: 13.2 % (ref 11.5–15.5)
WBC: 10.1 10*3/uL (ref 4.0–10.5)

## 2014-07-02 LAB — URINALYSIS W MICROSCOPIC + REFLEX CULTURE
BACTERIA UA: NONE SEEN
Bilirubin Urine: NEGATIVE
CASTS: NONE SEEN
CRYSTALS: NONE SEEN
GLUCOSE, UA: NEGATIVE mg/dL
HGB URINE DIPSTICK: NEGATIVE
Ketones, ur: NEGATIVE mg/dL
Leukocytes, UA: NEGATIVE
NITRITE: NEGATIVE
Protein, ur: NEGATIVE mg/dL
Specific Gravity, Urine: 1.02 (ref 1.005–1.030)
Squamous Epithelial / LPF: NONE SEEN
Urobilinogen, UA: 0.2 mg/dL (ref 0.0–1.0)
pH: 7.5 (ref 5.0–8.0)

## 2014-07-02 LAB — FOLLICLE STIMULATING HORMONE: FSH: 3.6 m[IU]/mL

## 2015-09-30 ENCOUNTER — Ambulatory Visit (INDEPENDENT_AMBULATORY_CARE_PROVIDER_SITE_OTHER): Payer: BLUE CROSS/BLUE SHIELD | Admitting: Gynecology

## 2015-09-30 ENCOUNTER — Encounter: Payer: Self-pay | Admitting: Gynecology

## 2015-09-30 VITALS — BP 120/76 | Ht 64.0 in | Wt 155.0 lb

## 2015-09-30 DIAGNOSIS — Z01419 Encounter for gynecological examination (general) (routine) without abnormal findings: Secondary | ICD-10-CM

## 2015-09-30 DIAGNOSIS — Z13220 Encounter for screening for lipoid disorders: Secondary | ICD-10-CM

## 2015-09-30 LAB — COMPREHENSIVE METABOLIC PANEL
ALT: 18 U/L (ref 6–29)
AST: 14 U/L (ref 10–30)
Albumin: 4.1 g/dL (ref 3.6–5.1)
Alkaline Phosphatase: 72 U/L (ref 33–115)
BUN: 12 mg/dL (ref 7–25)
CALCIUM: 9.2 mg/dL (ref 8.6–10.2)
CO2: 28 mmol/L (ref 20–31)
Chloride: 103 mmol/L (ref 98–110)
Creat: 0.95 mg/dL (ref 0.50–1.10)
GLUCOSE: 81 mg/dL (ref 65–99)
POTASSIUM: 4.1 mmol/L (ref 3.5–5.3)
Sodium: 140 mmol/L (ref 135–146)
Total Bilirubin: 0.4 mg/dL (ref 0.2–1.2)
Total Protein: 7.2 g/dL (ref 6.1–8.1)

## 2015-09-30 LAB — LIPID PANEL
CHOL/HDL RATIO: 3.1 ratio (ref <=5.0)
CHOLESTEROL: 204 mg/dL — AB (ref 125–200)
HDL: 66 mg/dL (ref >=46)
LDL Cholesterol: 84 mg/dL (ref <130)
TRIGLYCERIDES: 270 mg/dL — AB (ref <150)
VLDL: 54 mg/dL — AB (ref <30)

## 2015-09-30 LAB — CBC WITH DIFFERENTIAL/PLATELET
BASOS PCT: 0 %
Basophils Absolute: 0 cells/uL (ref 0–200)
EOS ABS: 186 {cells}/uL (ref 15–500)
Eosinophils Relative: 2 %
HCT: 43.4 % (ref 35.0–45.0)
Hemoglobin: 14.3 g/dL (ref 11.7–15.5)
LYMPHS PCT: 45 %
Lymphs Abs: 4185 cells/uL — ABNORMAL HIGH (ref 850–3900)
MCH: 31.1 pg (ref 27.0–33.0)
MCHC: 32.9 g/dL (ref 32.0–36.0)
MCV: 94.3 fL (ref 80.0–100.0)
MPV: 9.5 fL (ref 7.5–12.5)
Monocytes Absolute: 465 cells/uL (ref 200–950)
Monocytes Relative: 5 %
NEUTROS PCT: 48 %
Neutro Abs: 4464 cells/uL (ref 1500–7800)
Platelets: 296 10*3/uL (ref 140–400)
RBC: 4.6 MIL/uL (ref 3.80–5.10)
RDW: 13.4 % (ref 11.0–15.0)
WBC: 9.3 10*3/uL (ref 3.8–10.8)

## 2015-09-30 MED ORDER — MEGESTROL ACETATE 20 MG PO TABS: 20.0000 mg | ORAL_TABLET | Freq: Every day | ORAL | Status: DC

## 2015-09-30 NOTE — Patient Instructions (Signed)

## 2015-09-30 NOTE — Progress Notes (Signed)
    Megan Wise 1975-09-02 [id number]        40 y.o.  G2P1011  for annual exam.  Doing well. Accompanied by friend and daughter who helped interpret.  Past medical history,surgical history, problem list, medications, allergies, family history and social history were all reviewed and documented as reviewed in the EPIC chart.  ROS:  Performed with pertinent positives and negatives included in the history, assessment and plan.   Additional significant findings :  none   Exam: Megan Wise Filed Vitals:   09/30/15 1459  BP: 120/76  Height: 5\' 4"  (1.626 m)  Weight: 155 lb (70.308 kg)   General appearance:  Normal affect, orientation and appearance. Skin: Grossly normal HEENT: Without gross lesions.  No cervical or supraclavicular adenopathy. Thyroid normal.  Lungs:  Clear without wheezing, rales or rhonchi Cardiac: RR, without RMG Abdominal:  Soft, nontender, without masses, guarding, rebound, organomegaly or hernia Breasts:  Examined lying and sitting without masses, retractions, discharge or axillary adenopathy. Pelvic:  Ext/BUS/vagina normal  Cervix normal  Uterus retroverted, normal size, shape and contour, midline and mobile nontender   Adnexa without masses or tenderness    Anus and perineum normal   Rectovaginal normal sphincter tone without palpated masses or tenderness.    Assessment/Plan:  40 y.o. 372P1011 female for annual exam with light regular menses, condom contraception.   1. Mammography 01/2015. Patient resented complaining of a left axillary swelling and prominence of tissue below her left breast. Mammography and ultrasound over both these areas were normal. Examining physician at that time felt no abnormalities. They felt that she was feeling her costal margin below her breast. Her exam today is totally normal with no palpable breast/axillary or rib margin abnormalities. Reassured patient that everything felt normal and for her to continue with self exams and  report any changes in what she is feeling. Recommended follow up mammogram in August when she is due.  2. Pap smear/HPV 05/2013 negative.  No Pap smear done today. 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 Colposcopy/ECC negative at that time.  Follow up Pap smear's normal.   3. Contraception. Patient continues using condoms. We have discussed this multiple times and she understands the failure risks and declines any alternatives. 4. Health maintenance. Baseline CBC, CMP, lipid profile, urinalysis ordered. Follow up in one year, sooner as needed.   [name],[name] P MD, 3:21 PM 09/30/2015

## 2015-10-01 ENCOUNTER — Other Ambulatory Visit: Payer: Self-pay | Admitting: Gynecology

## 2015-10-01 ENCOUNTER — Telehealth: Payer: Self-pay | Admitting: *Deleted

## 2015-10-01 DIAGNOSIS — E781 Pure hyperglyceridemia: Secondary | ICD-10-CM

## 2015-10-01 LAB — URINALYSIS W MICROSCOPIC + REFLEX CULTURE
BILIRUBIN URINE: NEGATIVE
Bacteria, UA: NONE SEEN [HPF]
Casts: NONE SEEN [LPF]
Glucose, UA: NEGATIVE
Hgb urine dipstick: NEGATIVE
Leukocytes, UA: NEGATIVE
NITRITE: NEGATIVE
PH: 7 (ref 5.0–8.0)
Protein, ur: NEGATIVE
RBC / HPF: NONE SEEN RBC/HPF (ref <=2)
SPECIFIC GRAVITY, URINE: 1.028 (ref 1.001–1.035)
WBC UA: NONE SEEN WBC/HPF (ref <=5)
Yeast: NONE SEEN [HPF]

## 2015-10-01 NOTE — Telephone Encounter (Signed)
-----   Message from Dara Lordsimothy P Fontaine, MD sent at 09/30/2015  2:56 PM EDT ----- Call her pharmacy and tell them I made a mistake putting a prescription for Megace under her name. I canceled it on my computer but this is the wrong patient and she is not to receive that medication.

## 2015-10-01 NOTE — Telephone Encounter (Signed)
I had to leave the below on pharmacy voicemail the line would not connect to a live person. I left my direct # to call me if questions.

## 2015-10-02 ENCOUNTER — Other Ambulatory Visit: Payer: BLUE CROSS/BLUE SHIELD

## 2015-10-02 DIAGNOSIS — E781 Pure hyperglyceridemia: Secondary | ICD-10-CM

## 2015-10-02 LAB — LIPID PANEL
Cholesterol: 209 mg/dL — ABNORMAL HIGH (ref 125–200)
HDL: 73 mg/dL (ref >=46)
LDL CALC: 95 mg/dL (ref <130)
TRIGLYCERIDES: 207 mg/dL — AB (ref <150)
Total CHOL/HDL Ratio: 2.9 Ratio (ref <=5.0)
VLDL: 41 mg/dL — ABNORMAL HIGH (ref <30)

## 2016-12-06 ENCOUNTER — Ambulatory Visit (INDEPENDENT_AMBULATORY_CARE_PROVIDER_SITE_OTHER): Payer: BLUE CROSS/BLUE SHIELD | Admitting: Gynecology

## 2016-12-06 ENCOUNTER — Other Ambulatory Visit: Payer: Self-pay | Admitting: Gynecology

## 2016-12-06 ENCOUNTER — Encounter: Payer: Self-pay | Admitting: Gynecology

## 2016-12-06 VITALS — BP 140/86 | Ht 64.0 in | Wt 158.0 lb

## 2016-12-06 DIAGNOSIS — Z13220 Encounter for screening for lipoid disorders: Secondary | ICD-10-CM | POA: Diagnosis not present

## 2016-12-06 DIAGNOSIS — Z01419 Encounter for gynecological examination (general) (routine) without abnormal findings: Secondary | ICD-10-CM

## 2016-12-06 DIAGNOSIS — R102 Pelvic and perineal pain: Secondary | ICD-10-CM

## 2016-12-06 DIAGNOSIS — Z1231 Encounter for screening mammogram for malignant neoplasm of breast: Secondary | ICD-10-CM

## 2016-12-06 LAB — CBC WITH DIFFERENTIAL/PLATELET
BASOS ABS: 0 {cells}/uL (ref 0–200)
BASOS PCT: 0 %
EOS PCT: 2 %
Eosinophils Absolute: 178 cells/uL (ref 15–500)
HCT: 45.6 % — ABNORMAL HIGH (ref 35.0–45.0)
Hemoglobin: 14.8 g/dL (ref 11.7–15.5)
LYMPHS PCT: 45 %
Lymphs Abs: 4005 cells/uL — ABNORMAL HIGH (ref 850–3900)
MCH: 31.6 pg (ref 27.0–33.0)
MCHC: 32.5 g/dL (ref 32.0–36.0)
MCV: 97.2 fL (ref 80.0–100.0)
MONOS PCT: 5 %
MPV: 9.3 fL (ref 7.5–12.5)
Monocytes Absolute: 445 cells/uL (ref 200–950)
NEUTROS ABS: 4272 {cells}/uL (ref 1500–7800)
Neutrophils Relative %: 48 %
Platelets: 296 10*3/uL (ref 140–400)
RBC: 4.69 MIL/uL (ref 3.80–5.10)
RDW: 13.4 % (ref 11.0–15.0)
WBC: 8.9 10*3/uL (ref 3.8–10.8)

## 2016-12-06 NOTE — Addendum Note (Signed)
Addended by: Dayna BarkerGARDNER, KIMBERLY K on: 12/06/2016 12:42 PM   Modules accepted: Orders

## 2016-12-06 NOTE — Progress Notes (Signed)
    Megan Wise 1975-10-28 [id number]        41 y.o.  G2P1011 for annual exam.  Also complaining of pelvic pressure in the vaginal area when she is squatting. No real pain but more pushing down her bearing-down symptoms. No urinary symptoms such as frequency, dysuria, urgency, low back pain, fever or chills. No GI symptoms such as nausea, no vomiting, diarrhea, constipation. Currently not sexually active.  Past medical history,surgical history, problem list, medications, allergies, family history and social history were all reviewed and documented as reviewed in the EPIC chart.  ROS:  Performed with pertinent positives and negatives included in the history, assessment and plan.   Additional significant findings :  None   Exam: Kennon PortelaKim Gardner assistant Vitals:   12/06/16 1123  BP: 140/86  Weight: 158 lb (71.7 kg)  Height: 5\' 4"  (1.626 m)   Body mass index is 27.12 kg/m.  General appearance:  Normal affect, orientation and appearance. Skin: Grossly normal HEENT: Without gross lesions.  No cervical or supraclavicular adenopathy. Thyroid normal.  Lungs:  Clear without wheezing, rales or rhonchi Cardiac: RR, without RMG Abdominal:  Soft, nontender, without masses, guarding, rebound, organomegaly or hernia Breasts:  Examined lying and sitting without masses, retractions, discharge or axillary adenopathy. Pelvic:  Ext, BUS, Vagina: Normal  Cervix: Normal. Pap smear done  Uterus: Retroverted, normal size, shape and contour, midline and mobile nontender   Adnexa: Without masses or tenderness    Anus and perineum: Normal   Rectovaginal: Normal sphincter tone without palpated masses or tenderness.    Assessment/Plan:  41 y.o. 912P1011 female for annual exam with regular menses, abstinent contraception.   1. Pelvic/vaginal pressure with squatting. Exam is normal other than retroverted uterus. No localizing symptoms to suggest GU or GI. Regular monthly menses. Recommend baseline ultrasound to  rule out nonpalpable abnormality such as ovarian and patient will schedule follow up for this. 2. Pap smear/HPV 2014. Pap smear done today. 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 Colposcopy/ECC negative at that time. 3. Mammography 2016. Recommended patient call and schedule mammogram now with names and numbers provided and patient agrees to do so. SBE monthly reviewed. Breast exam normal today. 4. Contraception. Patient not currently sexually active and does not plan to be. Using barrier contraception when she was which is acceptable to her. 5. Health maintenance. Baseline CBC, CMP, lipid profile, urinalysis ordered. Blood pressure 140/86 discussed with her. She does have a blood pressure cuff at home and I reviewed normal readings with her. She'll check this in a non-exam situation and follow up if remains elevated. Follow up for ultrasound. Follow up for mammogram. Follow up 1 year for annual exam.  Additional time in excess of her routine gynecologic exam was spent in direct face to face counseling and coordination of care in regards to her pelvic pressure complaints.   [name],Candance Bohlman P MD, 11:44 AM 12/06/2016

## 2016-12-06 NOTE — Patient Instructions (Signed)
Follow up for ultrasound as scheduled  Recheck your blood pressure at home.  It should be 120/70 range   Call to Schedule your mammogram  Facilities in [place]: 1)  [facility]. [facility], [phone] Phone: [phone] 2)  Dr. [facility]Bertrand at [facility]  [address] Phone: [phone]     Mammogram A mammogram is an X-ray test to find changes in a woman's breast. You should get a mammogram if:  You are 43 years of age or older  You have risk factors.   Your doctor recommends that you have one.  BEFORE THE TEST  Do not schedule the test the week before your period, especially if your breasts are sore during this time.  On the day of your mammogram:  Wash your breasts and armpits well. After washing, do not put on any deodorant or talcum powder on until after your test.   Eat and drink as you usually do.   Take your medicines as usual.   If you are diabetic and take insulin, make sure you:   Eat before coming for your test.   Take your insulin as usual.   If you cannot keep your appointment, call before the appointment to cancel. Schedule another appointment.  TEST  You will need to undress from the waist up. You will put on a hospital gown.   Your breast will be put on the mammogram machine, and it will press firmly on your breast with a piece of plastic called a compression paddle. This will make your breast flatter so that the machine can X-ray all parts of your breast.   Both breasts will be X-rayed. Each breast will be X-rayed from above and from the side. An X-ray might need to be taken again if the picture is not good enough.   The mammogram will last about 15 to 30 minutes.  AFTER THE TEST Finding out the results of your test Ask when your test results will be ready. Make sure you get your test results.  Document Released: 09/09/2008 Document Revised: 06/02/2011 Document Reviewed:  09/09/2008 [facility] Patient Information 2012 [place].

## 2016-12-07 LAB — COMPREHENSIVE METABOLIC PANEL
ALT: 20 U/L (ref 6–29)
AST: 15 U/L (ref 10–30)
Albumin: 4 g/dL (ref 3.6–5.1)
Alkaline Phosphatase: 72 U/L (ref 33–115)
BUN: 12 mg/dL (ref 7–25)
CHLORIDE: 101 mmol/L (ref 98–110)
CO2: 25 mmol/L (ref 20–31)
CREATININE: 0.8 mg/dL (ref 0.50–1.10)
Calcium: 9.2 mg/dL (ref 8.6–10.2)
GLUCOSE: 73 mg/dL (ref 65–99)
POTASSIUM: 4.6 mmol/L (ref 3.5–5.3)
SODIUM: 135 mmol/L (ref 135–146)
TOTAL PROTEIN: 7 g/dL (ref 6.1–8.1)
Total Bilirubin: 0.3 mg/dL (ref 0.2–1.2)

## 2016-12-07 LAB — LIPID PANEL
CHOLESTEROL: 204 mg/dL — AB (ref <200)
HDL: 58 mg/dL (ref >50)
LDL Cholesterol: 86 mg/dL (ref <100)
TRIGLYCERIDES: 299 mg/dL — AB (ref <150)
Total CHOL/HDL Ratio: 3.5 Ratio (ref <5.0)
VLDL: 60 mg/dL — ABNORMAL HIGH (ref <30)

## 2016-12-07 LAB — URINALYSIS W MICROSCOPIC + REFLEX CULTURE
BACTERIA UA: NONE SEEN [HPF]
BILIRUBIN URINE: NEGATIVE
CASTS: NONE SEEN [LPF]
CRYSTALS: NONE SEEN [HPF]
Glucose, UA: NEGATIVE
HGB URINE DIPSTICK: NEGATIVE
KETONES UR: NEGATIVE
Leukocytes, UA: NEGATIVE
Nitrite: NEGATIVE
PROTEIN: NEGATIVE
RBC / HPF: NONE SEEN RBC/HPF (ref <=2)
SPECIFIC GRAVITY, URINE: 1.016 (ref 1.001–1.035)
SQUAMOUS EPITHELIAL / LPF: NONE SEEN [HPF] (ref <=5)
WBC UA: NONE SEEN WBC/HPF (ref <=5)
Yeast: NONE SEEN [HPF]
pH: 5.5 (ref 5.0–8.0)

## 2016-12-08 LAB — PAP IG W/ RFLX HPV ASCU

## 2016-12-13 ENCOUNTER — Other Ambulatory Visit: Payer: Self-pay | Admitting: *Deleted

## 2016-12-13 DIAGNOSIS — E7800 Pure hypercholesterolemia, unspecified: Secondary | ICD-10-CM

## 2016-12-15 ENCOUNTER — Ambulatory Visit
Admission: RE | Admit: 2016-12-15 | Discharge: 2016-12-15 | Disposition: A | Discharge status: Home / Self Care | Payer: BLUE CROSS/BLUE SHIELD | Source: Ambulatory Visit | Attending: Gynecology | Admitting: Gynecology

## 2016-12-15 DIAGNOSIS — Z1231 Encounter for screening mammogram for malignant neoplasm of breast: Secondary | ICD-10-CM

## 2017-01-04 ENCOUNTER — Other Ambulatory Visit: Payer: BLUE CROSS/BLUE SHIELD

## 2017-01-04 ENCOUNTER — Ambulatory Visit (INDEPENDENT_AMBULATORY_CARE_PROVIDER_SITE_OTHER): Payer: BLUE CROSS/BLUE SHIELD

## 2017-01-04 ENCOUNTER — Ambulatory Visit (INDEPENDENT_AMBULATORY_CARE_PROVIDER_SITE_OTHER): Payer: BLUE CROSS/BLUE SHIELD | Admitting: Gynecology

## 2017-01-04 ENCOUNTER — Encounter: Payer: Self-pay | Admitting: Gynecology

## 2017-01-04 VITALS — BP 128/86

## 2017-01-04 DIAGNOSIS — R102 Pelvic and perineal pain: Secondary | ICD-10-CM | POA: Diagnosis not present

## 2017-01-04 DIAGNOSIS — E7800 Pure hypercholesterolemia, unspecified: Secondary | ICD-10-CM

## 2017-01-04 LAB — LIPID PANEL
Cholesterol: 178 mg/dL (ref <200)
HDL: 61 mg/dL (ref >50)
LDL Cholesterol: 94 mg/dL (ref <100)
Total CHOL/HDL Ratio: 2.9 Ratio (ref <5.0)
Triglycerides: 115 mg/dL (ref <150)
VLDL: 23 mg/dL (ref <30)

## 2017-01-04 NOTE — Progress Notes (Addendum)
    Megan Wise Oct 29, 1975 [id number]        41 y.o.  G2P1011 presents for ultrasound.  Was seen at her annual exam or she complained of some pelvic pressure particularly when squatting or bending. Exam was normal. Ultrasound ordered to make sure we were not missing pelvic pathology. She had no associated urinary or GI symptoms.  Past medical history,surgical history, problem list, medications, allergies, family history and social history were all reviewed and documented in the EPIC chart.  Directed ROS with pertinent positives and negatives documented in the history of present illness/assessment and plan.  Exam: Vitals:   01/04/17 1500  BP: 128/86   General appearance:  Normal  Ultrasound transvaginal and transabdominal shows uterus normal size and echotexture. Endometrial echo 12.7 mm trilayered consistent with her menstrual history. Right and left ovaries are normal. Classic appearing corpus luteal cyst on left ovary. Cul-de-sac negative.  Assessment/Plan:  41 y.o. G2P1011 with complaints of pelvic pressure particularly when straining or bending. Exam and ultrasound are normal. Suspect musculoskeletal and reviewed with her. At this point no further workup recommended. Follow up in one year when due for annual exam, sooner as needed.    [name],TIMOTHY P MD, 3:11 PM 01/04/2017

## 2017-01-04 NOTE — Patient Instructions (Signed)
Follow up in one year for your annual exam, sooner if any issues. 

## 2017-01-09 ENCOUNTER — Other Ambulatory Visit: Payer: BLUE CROSS/BLUE SHIELD

## 2017-01-09 ENCOUNTER — Ambulatory Visit: Payer: BLUE CROSS/BLUE SHIELD | Admitting: Gynecology

## 2017-01-12 ENCOUNTER — Other Ambulatory Visit: Payer: BLUE CROSS/BLUE SHIELD

## 2017-11-17 ENCOUNTER — Other Ambulatory Visit: Payer: Self-pay | Admitting: Gynecology

## 2017-11-17 DIAGNOSIS — Z1231 Encounter for screening mammogram for malignant neoplasm of breast: Secondary | ICD-10-CM

## 2017-12-14 ENCOUNTER — Encounter: Payer: Self-pay | Admitting: Gynecology

## 2017-12-14 ENCOUNTER — Ambulatory Visit: Payer: BLUE CROSS/BLUE SHIELD | Admitting: Gynecology

## 2017-12-14 VITALS — BP 118/76 | Ht 63.0 in | Wt 165.0 lb

## 2017-12-14 DIAGNOSIS — Z01419 Encounter for gynecological examination (general) (routine) without abnormal findings: Secondary | ICD-10-CM | POA: Diagnosis not present

## 2017-12-14 DIAGNOSIS — Z13220 Encounter for screening for lipoid disorders: Secondary | ICD-10-CM

## 2017-12-14 LAB — LIPID PANEL
Cholesterol: 222 mg/dL — ABNORMAL HIGH (ref <200)
HDL: 64 mg/dL (ref >50)
LDL Cholesterol (Calc): 130 mg/dL (calc) — ABNORMAL HIGH
Non-HDL Cholesterol (Calc): 158 mg/dL (calc) — ABNORMAL HIGH (ref <130)
Total CHOL/HDL Ratio: 3.5 (calc) (ref <5.0)
Triglycerides: 168 mg/dL — ABNORMAL HIGH (ref <150)

## 2017-12-14 LAB — CBC WITH DIFFERENTIAL/PLATELET
Basophils Absolute: 69 {cells}/uL (ref 0–200)
Basophils Relative: 0.7 %
Eosinophils Absolute: 147 {cells}/uL (ref 15–500)
Eosinophils Relative: 1.5 %
HCT: 40.7 % (ref 35.0–45.0)
Hemoglobin: 14.2 g/dL (ref 11.7–15.5)
Lymphs Abs: 3587 {cells}/uL (ref 850–3900)
MCH: 32.2 pg (ref 27.0–33.0)
MCHC: 34.9 g/dL (ref 32.0–36.0)
MCV: 92.3 fL (ref 80.0–100.0)
MPV: 9.3 fL (ref 7.5–12.5)
Monocytes Relative: 6.7 %
Neutro Abs: 5341 {cells}/uL (ref 1500–7800)
Neutrophils Relative %: 54.5 %
Platelets: 272 10*3/uL (ref 140–400)
RBC: 4.41 Million/uL (ref 3.80–5.10)
RDW: 12.4 % (ref 11.0–15.0)
Total Lymphocyte: 36.6 %
WBC mixed population: 657 {cells}/uL (ref 200–950)
WBC: 9.8 10*3/uL (ref 3.8–10.8)

## 2017-12-14 LAB — COMPREHENSIVE METABOLIC PANEL
AG RATIO: 1.4 (calc) (ref 1.0–2.5)
ALBUMIN MSPROF: 3.8 g/dL (ref 3.6–5.1)
ALT: 43 U/L — ABNORMAL HIGH (ref 6–29)
AST: 20 U/L (ref 10–30)
Alkaline phosphatase (APISO): 81 U/L (ref 33–115)
BILIRUBIN TOTAL: 0.6 mg/dL (ref 0.2–1.2)
BUN: 12 mg/dL (ref 7–25)
CALCIUM: 9.1 mg/dL (ref 8.6–10.2)
CHLORIDE: 103 mmol/L (ref 98–110)
CO2: 25 mmol/L (ref 20–32)
Creat: 0.85 mg/dL (ref 0.50–1.10)
Globulin: 2.8 g/dL (calc) (ref 1.9–3.7)
Glucose, Bld: 104 mg/dL — ABNORMAL HIGH (ref 65–99)
POTASSIUM: 4.1 mmol/L (ref 3.5–5.3)
Sodium: 137 mmol/L (ref 135–146)
TOTAL PROTEIN: 6.6 g/dL (ref 6.1–8.1)

## 2017-12-14 NOTE — Progress Notes (Signed)
    Megan Wise 1975/09/10 [id number]        42 y.o.  G2P1011 for annual gynecologic exam.  Without gynecologic complaints  Past medical history,surgical history, problem list, medications, allergies, family history and social history were all reviewed and documented as reviewed in the EPIC chart.  ROS:  Performed with pertinent positives and negatives included in the history, assessment and plan.   Additional significant findings : None   Exam: Kennon PortelaKim Gardner assistant Vitals:   12/14/17 1359  BP: 118/76  Weight: 165 lb (74.8 kg)  Height: 5\' 3"  (1.6 m)   Body mass index is 29.23 kg/m.  General appearance:  Normal affect, orientation and appearance. Skin: Grossly normal HEENT: Without gross lesions.  No cervical or supraclavicular adenopathy. Thyroid normal.  Lungs:  Clear without wheezing, rales or rhonchi Cardiac: RR, without RMG Abdominal:  Soft, nontender, without masses, guarding, rebound, organomegaly or hernia Breasts:  Examined lying and sitting without masses, retractions, discharge or axillary adenopathy. Pelvic:  Ext, BUS, Vagina: Normal  Cervix: Normal  Uterus: Anteverted, normal size, shape and contour, midline and mobile nontender   Adnexa: Without masses or tenderness    Anus and perineum: Normal   Rectovaginal: Normal sphincter tone without palpated masses or tenderness.    Assessment/Plan:  42 y.o. 582P1011 female for annual gynecologic exam with regular menses, abstinent contraception.   1. Contraception reportedly not an issue. 2. Mammography scheduled next week.  Breast exam normal today. 3. Pap smear 2018.  No Pap smear done today.  Plan repeat Pap smear at 3-year interval per current screening guidelines.  History of ASCUS with positive high risk HPV in 2010 with negative colposcopy/ECC. 4. Health maintenance.  CBC, CMP and lipid profile ordered.  Follow-up in 1 year, sooner as needed.   Dara Lordsimothy P Myron Stankovich MD, 2:21 PM 12/14/2017

## 2017-12-14 NOTE — Patient Instructions (Signed)
Follow-up in 1 year for annual exam, sooner if any issues. 

## 2017-12-15 ENCOUNTER — Other Ambulatory Visit: Payer: Self-pay | Admitting: Gynecology

## 2017-12-15 DIAGNOSIS — R945 Abnormal results of liver function studies: Principal | ICD-10-CM

## 2017-12-15 DIAGNOSIS — R739 Hyperglycemia, unspecified: Secondary | ICD-10-CM

## 2017-12-15 DIAGNOSIS — R7989 Other specified abnormal findings of blood chemistry: Secondary | ICD-10-CM

## 2017-12-15 DIAGNOSIS — E7800 Pure hypercholesterolemia, unspecified: Secondary | ICD-10-CM

## 2017-12-18 ENCOUNTER — Ambulatory Visit
Admission: RE | Admit: 2017-12-18 | Discharge: 2017-12-18 | Disposition: A | Discharge status: Home / Self Care | Payer: BLUE CROSS/BLUE SHIELD | Source: Ambulatory Visit | Attending: Gynecology | Admitting: Gynecology

## 2017-12-18 DIAGNOSIS — Z1231 Encounter for screening mammogram for malignant neoplasm of breast: Secondary | ICD-10-CM

## 2018-01-15 ENCOUNTER — Other Ambulatory Visit: Payer: Self-pay

## 2018-01-15 LAB — COMPREHENSIVE METABOLIC PANEL
AG Ratio: 1.5 (calc) (ref 1.0–2.5)
ALT: 23 U/L (ref 6–29)
AST: 15 U/L (ref 10–30)
Albumin: 4.3 g/dL (ref 3.6–5.1)
Alkaline phosphatase (APISO): 76 U/L (ref 33–115)
BILIRUBIN TOTAL: 0.8 mg/dL (ref 0.2–1.2)
BUN: 10 mg/dL (ref 7–25)
CALCIUM: 9.2 mg/dL (ref 8.6–10.2)
CO2: 25 mmol/L (ref 20–32)
Chloride: 105 mmol/L (ref 98–110)
Creat: 0.75 mg/dL (ref 0.50–1.10)
Globulin: 2.8 g/dL (calc) (ref 1.9–3.7)
Glucose, Bld: 100 mg/dL — ABNORMAL HIGH (ref 65–99)
Potassium: 4.7 mmol/L (ref 3.5–5.3)
SODIUM: 138 mmol/L (ref 135–146)
TOTAL PROTEIN: 7.1 g/dL (ref 6.1–8.1)

## 2018-01-15 LAB — LIPID PANEL
Cholesterol: 188 mg/dL (ref <200)
HDL: 57 mg/dL (ref >50)
LDL CHOLESTEROL (CALC): 107 mg/dL — AB
Non-HDL Cholesterol (Calc): 131 mg/dL (calc) — ABNORMAL HIGH (ref <130)
TRIGLYCERIDES: 126 mg/dL (ref <150)
Total CHOL/HDL Ratio: 3.3 (calc) (ref <5.0)

## 2018-01-23 ENCOUNTER — Telehealth: Payer: Self-pay

## 2018-01-23 NOTE — Telephone Encounter (Signed)
Spoke with patient and informed her. °

## 2018-01-23 NOTE — Telephone Encounter (Signed)
Calling for recent lab results. They were follow up to previous abnormal results. What to tell patient?

## 2018-01-23 NOTE — Telephone Encounter (Signed)
Her repeat comprehensive metabolic panel and lipid profile both looked good.  Glucose at 100 noting less than 100s normal so this is not really a significant elevation.  Her LDL is minimally elevated at 107 but her HDL is good at 57 which compensates for this.  Total cholesterol is normal.

## 2019-02-25 ENCOUNTER — Other Ambulatory Visit: Payer: Self-pay

## 2019-02-25 DIAGNOSIS — Z20822 Contact with and (suspected) exposure to covid-19: Secondary | ICD-10-CM

## 2019-02-26 LAB — NOVEL CORONAVIRUS, NAA: SARS-CoV-2, NAA: NOT DETECTED

## 2019-03-19 ENCOUNTER — Encounter: Payer: Self-pay | Admitting: Gynecology

## 2019-04-10 ENCOUNTER — Emergency Department (HOSPITAL_BASED_OUTPATIENT_CLINIC_OR_DEPARTMENT_OTHER)
Admission: EM | Admit: 2019-04-10 | Discharge: 2019-04-10 | Disposition: A | Discharge status: Home / Self Care | Payer: BLUE CROSS/BLUE SHIELD | Attending: Emergency Medicine | Admitting: Emergency Medicine

## 2019-04-10 ENCOUNTER — Encounter (HOSPITAL_BASED_OUTPATIENT_CLINIC_OR_DEPARTMENT_OTHER): Payer: Self-pay | Admitting: Emergency Medicine

## 2019-04-10 ENCOUNTER — Other Ambulatory Visit: Payer: Self-pay

## 2019-04-10 DIAGNOSIS — R42 Dizziness and giddiness: Secondary | ICD-10-CM | POA: Diagnosis not present

## 2019-04-10 DIAGNOSIS — Z79899 Other long term (current) drug therapy: Secondary | ICD-10-CM | POA: Insufficient documentation

## 2019-04-10 HISTORY — DX: Dizziness and giddiness: R42

## 2019-04-10 MED ORDER — ONDANSETRON HCL 4 MG/2ML IJ SOLN
4.0000 mg | Freq: Once | INTRAMUSCULAR | Status: AC
  Administered 2019-04-10: 06:00:00 4 mg via INTRAVENOUS
  Filled 2019-04-10: qty 2

## 2019-04-10 MED ORDER — SODIUM CHLORIDE 0.9 % IV BOLUS (SEPSIS)
1000.0000 mL | Freq: Once | INTRAVENOUS | Status: AC
  Administered 2019-04-10: 06:00:00 1000 mL via INTRAVENOUS

## 2019-04-10 MED ORDER — MECLIZINE HCL 25 MG PO TABS: 25.0000 mg | ORAL_TABLET | Freq: Three times a day (TID) | ORAL | 0 refills | Status: AC | PRN

## 2019-04-10 MED ORDER — ONDANSETRON 8 MG PO TBDP
8.0000 mg | ORAL_TABLET | Freq: Once | ORAL | Status: DC
  Filled 2019-04-10: qty 1

## 2019-04-10 MED ORDER — MECLIZINE HCL 25 MG PO TABS
25.0000 mg | ORAL_TABLET | Freq: Once | ORAL | Status: AC
  Administered 2019-04-10: 06:00:00 25 mg via ORAL
  Filled 2019-04-10: qty 1

## 2019-04-10 NOTE — ED Notes (Signed)
Pt reports improvement in dizziness. Pt ambulatory to bathroom with assistance.

## 2019-04-10 NOTE — ED Provider Notes (Signed)
[facility] EMERGENCY DEPARTMENT Provider Note   CSN: [id number] Arrival date & time: 04/10/19  0508     History   Chief Complaint Chief Complaint  Patient presents with  . Dizziness    HPI Megan Wise is a 43 y.o. female.     The history is provided by the patient and a relative. A language interpreter was used ([id number] - [place]ian).  Dizziness Quality:  Vertigo Severity:  Moderate Onset quality:  Sudden Duration:  2 hours Timing:  Constant Progression:  Worsening Chronicity:  New Relieved by:  Nothing Worsened by:  Movement Associated symptoms: nausea, tinnitus and vomiting   Associated symptoms: no chest pain, no headaches and no shortness of breath   Risk factors: hx of vertigo   Risk factors: no heart disease, no hx of stroke, no multiple medications and no new medications   Patient reports abrupt onset of dizziness approximately 2 hours ago.  She reports she woke up with diaphoretic, had nausea no palpable pain.  No focal weakness, no headache.  No other medications.  She has had this previously.  No known history of stroke.  She had otherwise been well prior to this happening.  Past Medical History:  Diagnosis Date  . ASCUS (atypical squamous cells of undetermined significance) on Pap smear 10/2008   High risk HPV  . Vertigo     There are no active problems to display for this patient.   Past Surgical History:  Procedure Laterality Date  . CESAREAN SECTION    . COLPOSCOPY  10/2008   ECC neg     OB History    Gravida  2   Para  1   Term  1   Preterm      AB  1   Living  1     SAB      TAB      Ectopic      Multiple      Live Births               Home Medications    Prior to Admission medications   Medication Sig Start Date End Date Taking? Authorizing Provider  Multiple Vitamin (MULTIVITAMIN) tablet Take 1 tablet by mouth daily.    [provider]    Family History Family History  Problem Relation  Age of Onset  . Hypertension Mother   . Hypertension Father     Social History Social History   Tobacco Use  . Smoking status: Never Smoker  . Smokeless tobacco: Never Used  Substance Use Topics  . Alcohol use: Yes    Alcohol/week: 3.0 standard drinks    Types: 3 Standard drinks or equivalent per week  . Drug use: No     Allergies   Patient has no known allergies.   Review of Systems Review of Systems  Constitutional: Positive for diaphoresis. Negative for fever.  HENT: Positive for tinnitus.   Respiratory: Negative for shortness of breath.   Cardiovascular: Negative for chest pain.  Gastrointestinal: Positive for nausea and vomiting.  Neurological: Positive for dizziness. Negative for headaches.  All other systems reviewed and are negative.    Physical Exam Updated Vital Signs BP (!) 131/96   Pulse 84   Temp 98.4 F (36.9 C)   Resp 16   Ht 1.626 m (5\' 4" )   Wt 70.3 kg   SpO2 99%   BMI 26.61 kg/m   Physical Exam CONSTITUTIONAL: Well developed/well nourished, appears mildly uncomfortable HEAD: Normocephalic/atraumatic EYES:  EOMI/PERRL, no nystagmus, no ptosis ENMT: Mucous membranes moist, bilateral TMs clear and intact NECK: supple no meningeal signs, no bruits SPINE/BACK:entire spine nontender CV: S1/S2 noted, no murmurs/rubs/gallops noted LUNGS: Lungs are clear to auscultation bilaterally, no apparent distress ABDOMEN: soft, nontender, no rebound or guarding GU:no cva tenderness NEURO:Awake/alert, face symmetric, no arm or leg drift is noted Equal 5/5 strength with shoulder abduction, elbow flex/extension, wrist flex/extension in upper extremities and equal hand grips bilaterally Equal 5/5 strength with hip flexion,knee flex/extension, foot dorsi/plantar flexion Cranial nerves 3/4/5/6/01/02/09/11/12 tested and intact Sensation to light touch intact in all extremities EXTREMITIES: pulses normal, full ROM SKIN: warm, color normal PSYCH: no abnormalities  of mood noted, alert and oriented to situation    ED Treatments / Results  Labs (all labs ordered are listed, but only abnormal results are displayed) Labs Reviewed - No data to display  EKG EKG Interpretation  Date/Time:  Wednesday April 10 2019 05:14:34 EDT Ventricular Rate:  85 PR Interval:    QRS Duration: 80 QT Interval:  398 QTC Calculation: 474 R Axis:   14 Text Interpretation:  Sinus rhythm Low voltage, precordial leads Confirmed by Ripley Fraise [phone]) on 04/10/2019 5:21:58 AM   Radiology No results found.  Procedures Procedures   Medications Ordered in ED Medications  meclizine (ANTIVERT) tablet 25 mg (25 mg Oral Given 04/10/19 0605)  sodium chloride 0.9 % bolus 1,000 mL (1,000 mLs Intravenous New Bag/Given 04/10/19 0550)  ondansetron (ZOFRAN) injection 4 mg (4 mg Intravenous Given 04/10/19 0553)     Initial Impression / Assessment and Plan / ED Course  I have reviewed the triage vital signs and the nursing notes.          5:51 AM Patient presents with abrupt onset of vertigo.  She has had this before.  She is low risk for stroke.  No focal neuro deficits.  Will treat symptoms and reassess 6:39 AM Patient is beginning to feel improved.  Strong suspicion for peripheral vertigo due to abrupt onset of symptoms with no focal neuro deficits Patient was cautioned on driving until symptoms have improved.  She will be referred to ear nose and throat and given meclizine as an outpatient.  Follow as patient is able to ambulate she will be able to be discharged home. [place] interpreter utilized, but the patient does speak some English. 6:54 AM Patient ambulated without ataxia.  She is taking p.o. fluids.  She feels comfortable for discharge home. Final Clinical Impressions(s) / ED Diagnoses   Final diagnoses:  Vertigo    ED Discharge Orders         Ordered    meclizine (ANTIVERT) 25 MG tablet  3 times daily PRN     04/10/19 0539            Ripley Fraise, MD 04/10/19 [phone]

## 2019-04-10 NOTE — Discharge Instructions (Signed)

## 2019-04-10 NOTE — ED Triage Notes (Signed)
Pt awoke at 3 am with dizziness, nausea and vomiting. Pt reports similar symptoms previously and dx with vertigo.

## 2019-08-13 IMAGING — MG DIGITAL SCREENING BILATERAL MAMMOGRAM WITH TOMO AND CAD
8 series · 8 of 24 positions shown · non-contrast
Comparison: Previous exam(s).

CLINICAL DATA: Screening.

EXAM:
DIGITAL SCREENING BILATERAL MAMMOGRAM WITH TOMO AND CAD

[L CC synth-2D]
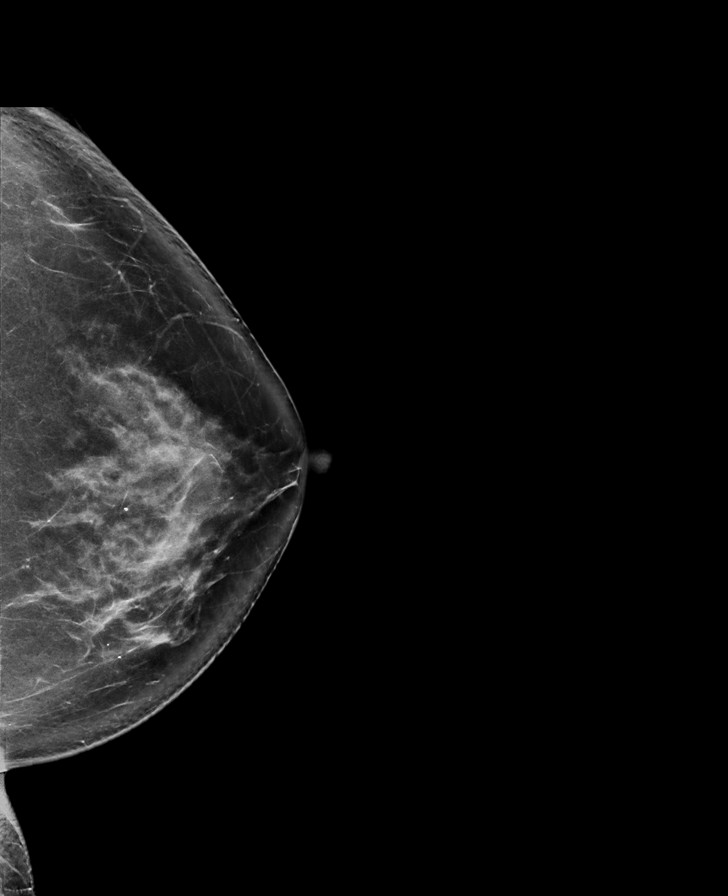

[R MLO synth-2D]
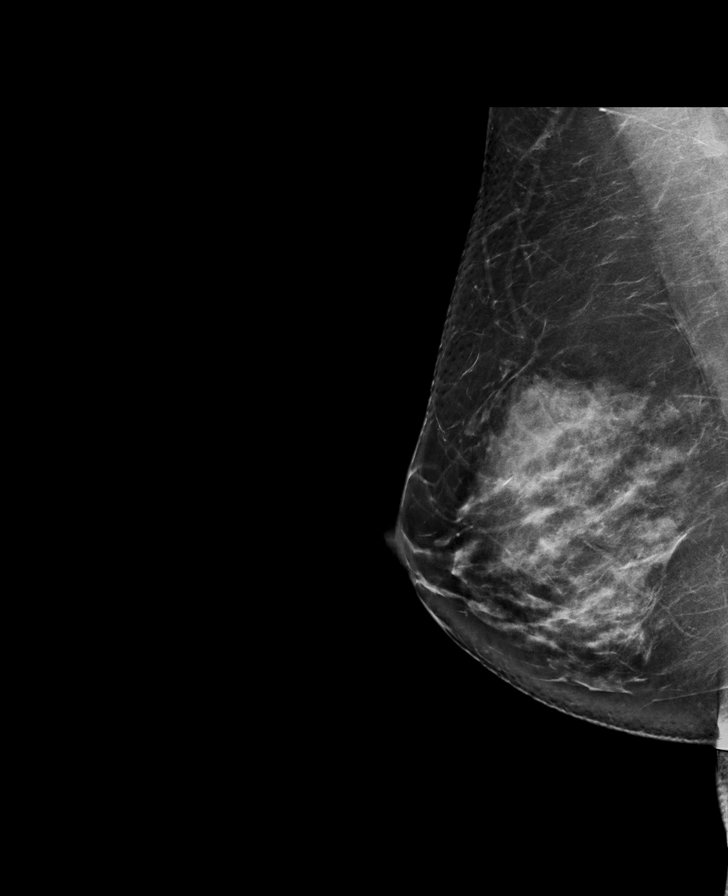

[R CC synth-2D]
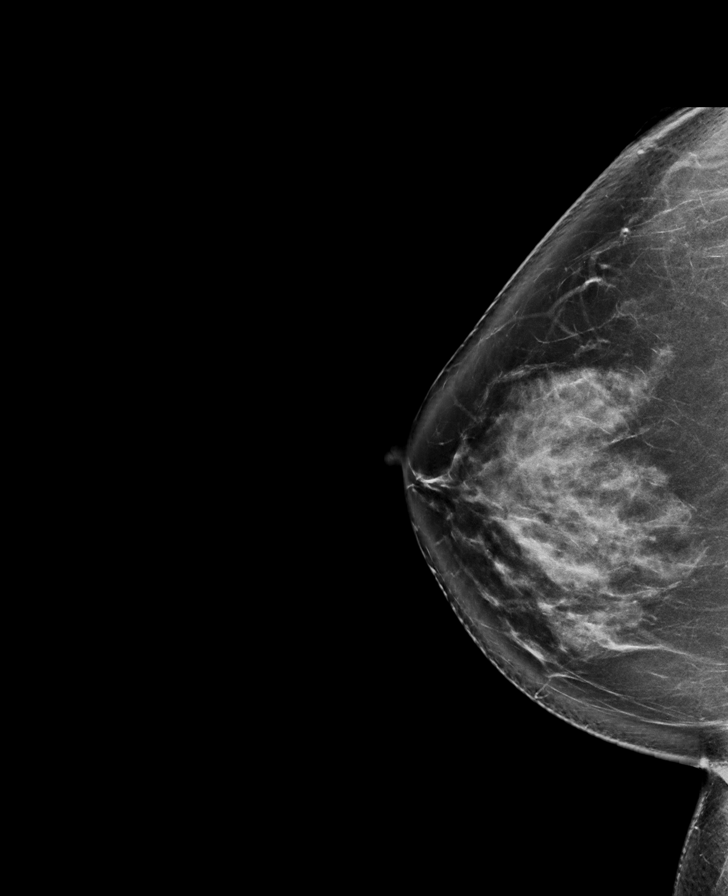

[L MLO synth-2D]
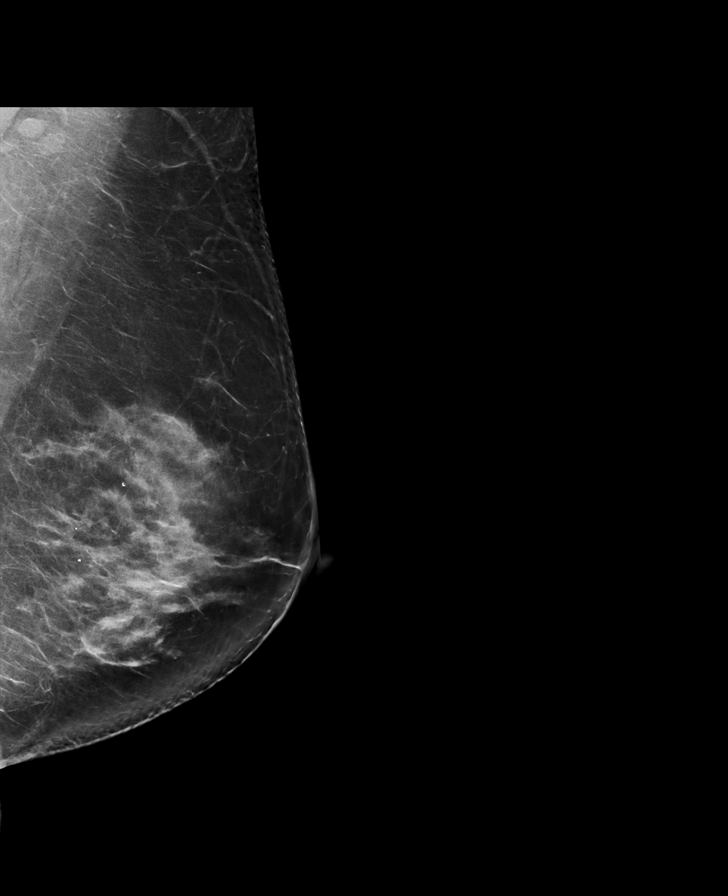

[R MLO tomo · tomo slice 47/92.0]
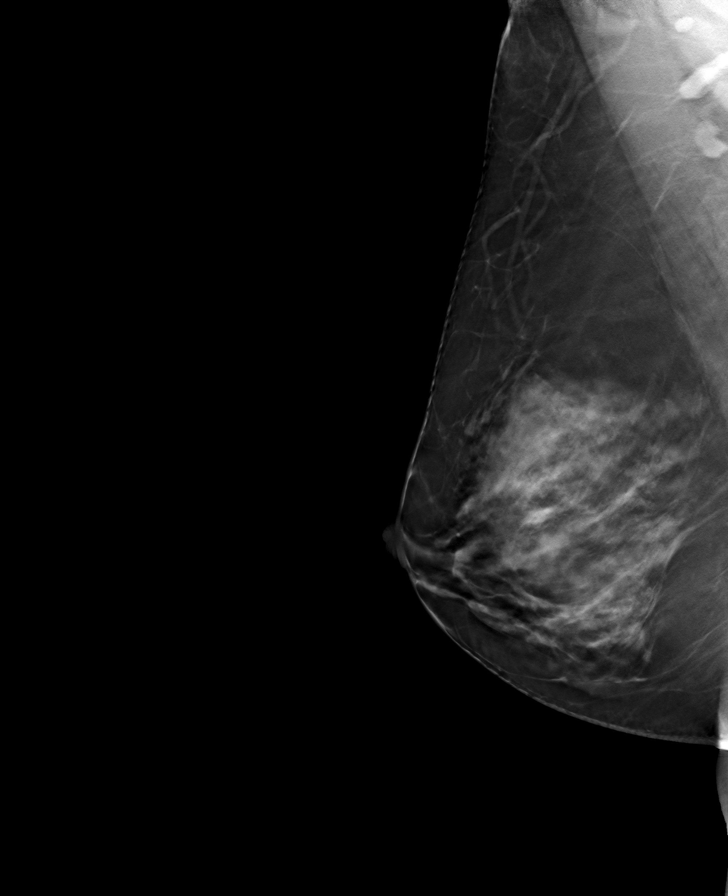

[R CC tomo · tomo slice 46/91.0]
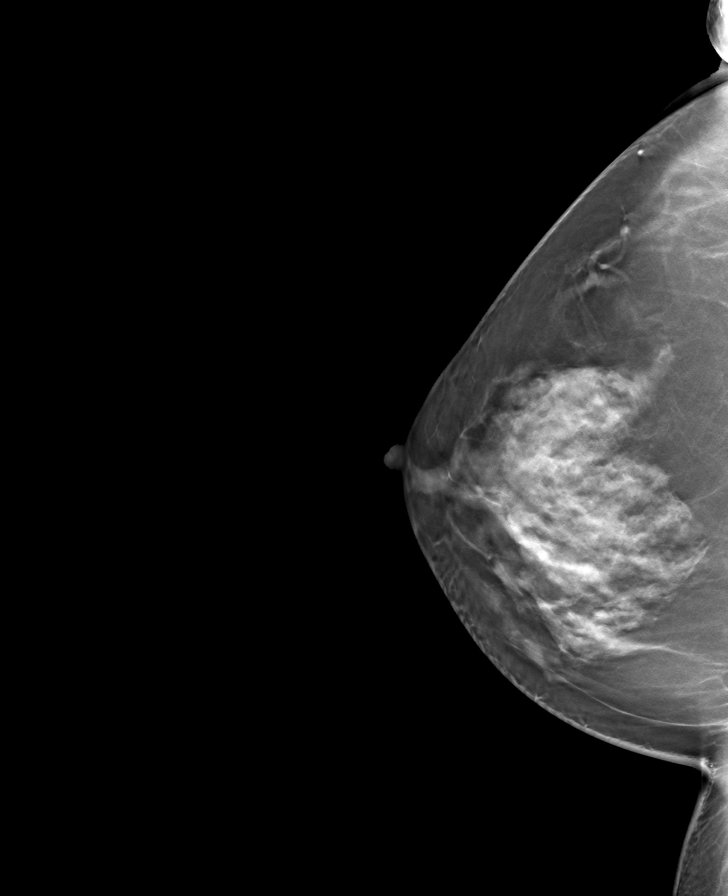

[L MLO tomo · tomo slice 47/94.0]
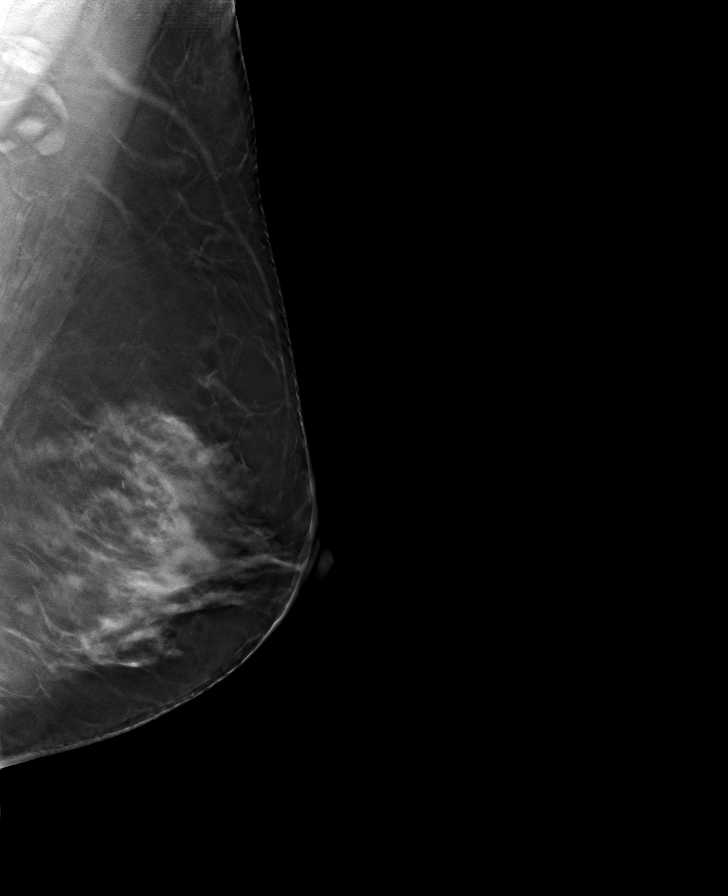

[L CC tomo · tomo slice 45/90.0]
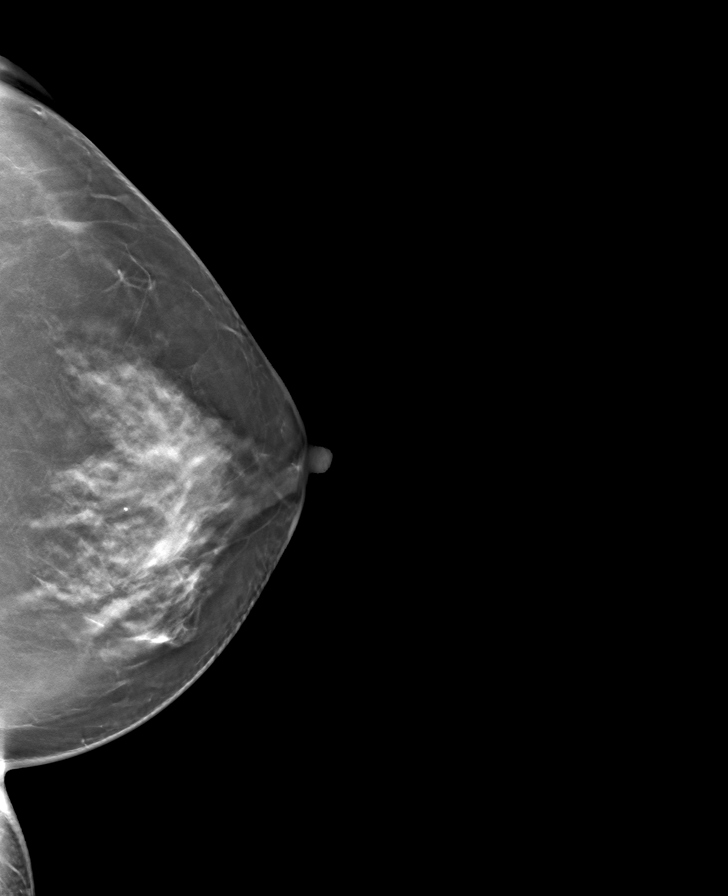

[8 of 24 positions shown; findings below may reference images not displayed]

ACR Breast Density Category c: The breast tissue is heterogeneously
dense, which may obscure small masses.
FINDINGS: There are no findings suspicious for malignancy. Images were
processed with CAD.
IMPRESSION: No mammographic evidence of malignancy. A result letter of this
screening mammogram will be mailed directly to the patient.

RECOMMENDATION:
Screening mammogram in one year. (Code:[id number])

BI-RADS CATEGORY  1: Negative.
